# Patient Record
Sex: Female | Born: 1970 | Race: White | Hispanic: No | Marital: Single | Smoking: Current every day smoker
Health system: Southern US, Community
[De-identification: ages and names within clinical notes are randomized; demographics above are authoritative.]

## PROBLEM LIST (undated history)

## (undated) ENCOUNTER — Ambulatory Visit: Admission: EM | Source: Home / Self Care

## (undated) DIAGNOSIS — N760 Acute vaginitis: Secondary | ICD-10-CM

## (undated) DIAGNOSIS — F419 Anxiety disorder, unspecified: Secondary | ICD-10-CM

## (undated) DIAGNOSIS — B9689 Other specified bacterial agents as the cause of diseases classified elsewhere: Secondary | ICD-10-CM

## (undated) DIAGNOSIS — J45909 Unspecified asthma, uncomplicated: Secondary | ICD-10-CM

## (undated) DIAGNOSIS — N730 Acute parametritis and pelvic cellulitis: Secondary | ICD-10-CM

## (undated) HISTORY — PX: WISDOM TOOTH EXTRACTION: SHX21

---

## 2002-11-15 ENCOUNTER — Other Ambulatory Visit: Admission: RE | Admit: 2002-11-15 | Discharge: 2002-11-15 | Payer: Self-pay | Admitting: Obstetrics and Gynecology

## 2006-07-09 ENCOUNTER — Ambulatory Visit (HOSPITAL_COMMUNITY): Payer: Self-pay | Admitting: Psychiatry

## 2007-02-16 ENCOUNTER — Ambulatory Visit (HOSPITAL_COMMUNITY): Payer: Self-pay | Admitting: Psychiatry

## 2007-10-05 ENCOUNTER — Ambulatory Visit (HOSPITAL_COMMUNITY): Payer: Self-pay | Admitting: Psychiatry

## 2011-08-06 ENCOUNTER — Encounter (HOSPITAL_BASED_OUTPATIENT_CLINIC_OR_DEPARTMENT_OTHER): Payer: Self-pay

## 2011-08-06 ENCOUNTER — Emergency Department (HOSPITAL_BASED_OUTPATIENT_CLINIC_OR_DEPARTMENT_OTHER)
Admission: EM | Admit: 2011-08-06 | Discharge: 2011-08-06 | Disposition: A | Discharge status: Home / Self Care | Payer: Self-pay | Attending: Emergency Medicine | Admitting: Emergency Medicine

## 2011-08-06 DIAGNOSIS — M545 Low back pain, unspecified: Secondary | ICD-10-CM | POA: Insufficient documentation

## 2011-08-06 DIAGNOSIS — R51 Headache: Secondary | ICD-10-CM | POA: Insufficient documentation

## 2011-08-06 DIAGNOSIS — IMO0002 Reserved for concepts with insufficient information to code with codable children: Secondary | ICD-10-CM | POA: Insufficient documentation

## 2011-08-06 DIAGNOSIS — M546 Pain in thoracic spine: Secondary | ICD-10-CM | POA: Insufficient documentation

## 2011-08-06 DIAGNOSIS — S39012A Strain of muscle, fascia and tendon of lower back, initial encounter: Secondary | ICD-10-CM

## 2011-08-06 DIAGNOSIS — F17200 Nicotine dependence, unspecified, uncomplicated: Secondary | ICD-10-CM | POA: Insufficient documentation

## 2011-08-06 MED ORDER — HYDROCODONE-ACETAMINOPHEN 5-325 MG PO TABS: 1.0000 | ORAL_TABLET | ORAL | Status: AC | PRN

## 2011-08-06 MED ORDER — HYDROCODONE-ACETAMINOPHEN 5-325 MG PO TABS
1.0000 | ORAL_TABLET | Freq: Once | ORAL | Status: AC
  Administered 2011-08-06: 1 via ORAL
  Filled 2011-08-06: qty 1
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
## 2012-01-18 ENCOUNTER — Emergency Department (HOSPITAL_BASED_OUTPATIENT_CLINIC_OR_DEPARTMENT_OTHER)
Admission: EM | Admit: 2012-01-18 | Discharge: 2012-01-18 | Disposition: A | Discharge status: Home / Self Care | Payer: Self-pay | Attending: Emergency Medicine | Admitting: Emergency Medicine

## 2012-01-18 ENCOUNTER — Encounter (HOSPITAL_BASED_OUTPATIENT_CLINIC_OR_DEPARTMENT_OTHER): Payer: Self-pay | Admitting: *Deleted

## 2012-01-18 ENCOUNTER — Emergency Department (HOSPITAL_BASED_OUTPATIENT_CLINIC_OR_DEPARTMENT_OTHER): Payer: Self-pay

## 2012-01-18 DIAGNOSIS — S43499A Other sprain of unspecified shoulder joint, initial encounter: Secondary | ICD-10-CM | POA: Insufficient documentation

## 2012-01-18 DIAGNOSIS — X58XXXA Exposure to other specified factors, initial encounter: Secondary | ICD-10-CM | POA: Insufficient documentation

## 2012-01-18 DIAGNOSIS — M25511 Pain in right shoulder: Secondary | ICD-10-CM

## 2012-01-18 DIAGNOSIS — Z791 Long term (current) use of non-steroidal anti-inflammatories (NSAID): Secondary | ICD-10-CM | POA: Insufficient documentation

## 2012-01-18 DIAGNOSIS — S46819A Strain of other muscles, fascia and tendons at shoulder and upper arm level, unspecified arm, initial encounter: Secondary | ICD-10-CM

## 2012-01-18 DIAGNOSIS — Z79899 Other long term (current) drug therapy: Secondary | ICD-10-CM | POA: Insufficient documentation

## 2012-01-18 DIAGNOSIS — Y939 Activity, unspecified: Secondary | ICD-10-CM | POA: Insufficient documentation

## 2012-01-18 DIAGNOSIS — Y929 Unspecified place or not applicable: Secondary | ICD-10-CM | POA: Insufficient documentation

## 2012-01-18 DIAGNOSIS — F17200 Nicotine dependence, unspecified, uncomplicated: Secondary | ICD-10-CM | POA: Insufficient documentation

## 2012-01-18 MED ORDER — HYDROCODONE-ACETAMINOPHEN 5-500 MG PO TABS: 1.0000 | ORAL_TABLET | Freq: Four times a day (QID) | ORAL | Status: DC | PRN

## 2012-01-18 MED ORDER — CYCLOBENZAPRINE HCL 10 MG PO TABS: 10.0000 mg | ORAL_TABLET | Freq: Two times a day (BID) | ORAL | Status: DC | PRN

## 2012-01-18 NOTE — ED Notes (Signed)
Right arm pain for 3 weeks. No known injury.
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
## 2013-04-11 ENCOUNTER — Emergency Department (HOSPITAL_BASED_OUTPATIENT_CLINIC_OR_DEPARTMENT_OTHER)
Admission: EM | Admit: 2013-04-11 | Discharge: 2013-04-11 | Disposition: A | Discharge status: Home / Self Care | Payer: No Typology Code available for payment source | Attending: Emergency Medicine | Admitting: Emergency Medicine

## 2013-04-11 ENCOUNTER — Encounter (HOSPITAL_BASED_OUTPATIENT_CLINIC_OR_DEPARTMENT_OTHER): Payer: Self-pay | Admitting: Emergency Medicine

## 2013-04-11 DIAGNOSIS — Z3202 Encounter for pregnancy test, result negative: Secondary | ICD-10-CM | POA: Insufficient documentation

## 2013-04-11 DIAGNOSIS — B009 Herpesviral infection, unspecified: Secondary | ICD-10-CM | POA: Insufficient documentation

## 2013-04-11 DIAGNOSIS — N390 Urinary tract infection, site not specified: Secondary | ICD-10-CM | POA: Insufficient documentation

## 2013-04-11 DIAGNOSIS — F17200 Nicotine dependence, unspecified, uncomplicated: Secondary | ICD-10-CM | POA: Insufficient documentation

## 2013-04-11 DIAGNOSIS — N898 Other specified noninflammatory disorders of vagina: Secondary | ICD-10-CM | POA: Insufficient documentation

## 2013-04-11 LAB — WET PREP, GENITAL
Trich, Wet Prep: NONE SEEN
Yeast Wet Prep HPF POC: NONE SEEN

## 2013-04-11 LAB — URINALYSIS, ROUTINE W REFLEX MICROSCOPIC
Bilirubin Urine: NEGATIVE
GLUCOSE, UA: NEGATIVE mg/dL
KETONES UR: NEGATIVE mg/dL
Nitrite: NEGATIVE
PROTEIN: NEGATIVE mg/dL
Specific Gravity, Urine: 1.016 (ref 1.005–1.030)
UROBILINOGEN UA: 0.2 mg/dL (ref 0.0–1.0)
pH: 6.5 (ref 5.0–8.0)

## 2013-04-11 LAB — URINE MICROSCOPIC-ADD ON

## 2013-04-11 LAB — PREGNANCY, URINE: Preg Test, Ur: NEGATIVE

## 2013-04-11 MED ORDER — AZITHROMYCIN 250 MG PO TABS
1000.0000 mg | ORAL_TABLET | Freq: Once | ORAL | Status: AC
Start: 2013-04-11 — End: 2013-04-11
  Administered 2013-04-11: 1000 mg via ORAL
  Filled 2013-04-11: qty 4

## 2013-04-11 MED ORDER — CEFTRIAXONE SODIUM 1 G IJ SOLR
1.0000 g | Freq: Once | INTRAMUSCULAR | Status: AC
  Administered 2013-04-11: 1 g via INTRAMUSCULAR
  Filled 2013-04-11: qty 10

## 2013-04-11 MED ORDER — LIDOCAINE HCL (PF) 1 % IJ SOLN
INTRAMUSCULAR | Status: AC
  Administered 2013-04-11: 5 mL
  Filled 2013-04-11: qty 5

## 2013-04-11 MED ORDER — CEPHALEXIN 500 MG PO CAPS: 500.0000 mg | ORAL_CAPSULE | Freq: Four times a day (QID) | ORAL | Status: DC

## 2013-04-11 NOTE — ED Provider Notes (Signed)
CSN: [id number]     Arrival date & time 04/11/13  1227 History   First MD Initiated Contact with Patient 04/11/13 1230     Chief Complaint  Patient presents with  . Vaginal Bleeding  . Urinary Tract Infection   (Consider location/radiation/quality/duration/timing/severity/associated sxs/prior Treatment) HPI Comments: Patient presents with 5 days of urinary frequency and burning with urination. She reports previous urinary tract infections with similar symptoms. She has, however, had vaginal itching, irritation, burning and discharge as well.  Additionally, patient complains of a "cold sore" on the right side of her mouth. She says she gets these frequently.  Patient is a 43 y.o. female presenting with vaginal bleeding and urinary tract infection.  Vaginal Bleeding Associated symptoms: dysuria   Urinary Tract Infection    History reviewed. No pertinent past medical history. History reviewed. No pertinent past surgical history. No family history on file. History  Substance Use Topics  . Smoking status: Current Every Day Smoker -- 0.50 packs/day  . Smokeless tobacco: Not on file  . Alcohol Use: No   OB History   Grav Para Term Preterm Abortions TAB SAB Ect Mult Living                 Review of Systems  HENT: Positive for mouth sores.   Genitourinary: Positive for dysuria, frequency and vaginal bleeding.  All other systems reviewed and are negative.    Allergies  Review of patient's allergies indicates no known allergies.  Home Medications   Current Outpatient Rx  Name  Route  Sig  Dispense  Refill  . cyclobenzaprine (FLEXERIL) 10 MG tablet   Oral   Take 10 mg by mouth 3 (three) times daily as needed.         . cyclobenzaprine (FLEXERIL) 10 MG tablet   Oral   Take 1 tablet (10 mg total) by mouth 2 (two) times daily as needed for muscle spasms.   20 tablet   0   . HYDROcodone-acetaminophen (VICODIN) 5-500 MG per tablet   Oral   Take 1-2 tablets by mouth every  6 (six) hours as needed for pain.   15 tablet   0   . NAPROXEN PO   Oral   Take 2 tablets by mouth daily as needed. Patient used this medication for her headache.         . traMADol (ULTRAM) 50 MG tablet   Oral   Take 50 mg by mouth every 6 (six) hours as needed.          LMP 03/21/2013 Physical Exam  Constitutional: She is oriented to person, place, and time. She appears well-developed and well-nourished. No distress.  HENT:  Head: Normocephalic and atraumatic.  Right Ear: Hearing normal.  Left Ear: Hearing normal.  Nose: Nose normal.  Mouth/Throat: Oropharynx is clear and moist and mucous membranes are normal.  Eyes: Conjunctivae and EOM are normal. Pupils are equal, round, and reactive to light.  Neck: Normal range of motion. Neck supple.  Cardiovascular: Regular rhythm, S1 normal and S2 normal.  Exam reveals no gallop and no friction rub.   No murmur heard. Pulmonary/Chest: Effort normal and breath sounds normal. No respiratory distress. She exhibits no tenderness.  Abdominal: Soft. Normal appearance and bowel sounds are normal. There is no hepatosplenomegaly. There is no tenderness. There is no rebound, no guarding, no tenderness at McBurney's point and negative Murphy's sign. No hernia.  Genitourinary: Uterus normal. There is no rash or tenderness on the right labia. There  is no rash or tenderness on the left labia. Cervix exhibits motion tenderness. Cervix exhibits no friability. Right adnexum displays no mass, no tenderness and no fullness. Left adnexum displays tenderness. Left adnexum displays no mass and no fullness. No vaginal discharge found.  Musculoskeletal: Normal range of motion.  Neurological: She is alert and oriented to person, place, and time. She has normal strength. No cranial nerve deficit or sensory deficit. Coordination normal. GCS eye subscore is 4. GCS verbal subscore is 5. GCS motor subscore is 6.  Skin: Skin is warm, dry and intact. No rash noted. No  cyanosis.     Psychiatric: She has a normal mood and affect. Her speech is normal and behavior is normal. Thought content normal.    ED Course  Procedures (including critical care time) Labs Review Labs Reviewed  WET PREP, GENITAL - Abnormal; Notable for the following:    Clue Cells Wet Prep HPF POC FEW (*)    WBC, Wet Prep HPF POC FEW (*)    All other components within normal limits  URINALYSIS, ROUTINE W REFLEX MICROSCOPIC - Abnormal; Notable for the following:    APPearance CLOUDY (*)    Hgb urine dipstick MODERATE (*)    Leukocytes, UA MODERATE (*)    All other components within normal limits  URINE MICROSCOPIC-ADD ON - Abnormal; Notable for the following:    Bacteria, UA MANY (*)    All other components within normal limits  GC/CHLAMYDIA PROBE AMP  URINE CULTURE  PREGNANCY, URINE   Imaging Review No results found.  EKG Interpretation   None       MDM  Diagnosis: UTI  Patient presents to the ER for evaluation of dysuria. She has had vaginal irritation, discharge and bleeding as well. Urinalysis shows obvious infection. She did have cervical motion tenderness as well as tenderness in the area of the left adnexa on pelvic exam. No concern for tubo-ovarian abscess. Cervicitis might be secondary to the UTI, but was treated with Ceftin and Zithromax empirically here in the ER. We'll continue Keflex as an outpatient. Followup with OB/GYN.    Gilda Creasehristopher J. Pollina, MD 04/11/13 1359

## 2013-04-11 NOTE — Discharge Instructions (Signed)
Urinary Tract Infection  Urinary tract infections (UTIs) can develop anywhere along your urinary tract. Your urinary tract is your body's drainage system for removing wastes and extra water. Your urinary tract includes two kidneys, two ureters, a bladder, and a urethra. Your kidneys are a pair of bean-shaped organs. Each kidney is about the size of your fist. They are located below your ribs, one on each side of your spine.  CAUSES  Infections are caused by microbes, which are microscopic organisms, including fungi, viruses, and bacteria. These organisms are so small that they can only be seen through a microscope. Bacteria are the microbes that most commonly cause UTIs.  SYMPTOMS   Symptoms of UTIs may vary by age and gender of the patient and by the location of the infection. Symptoms in young women typically include a frequent and intense urge to urinate and a painful, burning feeling in the bladder or urethra during urination. Older women and men are more likely to be tired, shaky, and weak and have muscle aches and abdominal pain. A fever may mean the infection is in your kidneys. Other symptoms of a kidney infection include pain in your back or sides below the ribs, nausea, and vomiting.  DIAGNOSIS  To diagnose a UTI, your caregiver will ask you about your symptoms. Your caregiver also will ask to provide a urine sample. The urine sample will be tested for bacteria and white blood cells. White blood cells are made by your body to help fight infection.  TREATMENT   Typically, UTIs can be treated with medication. Because most UTIs are caused by a bacterial infection, they usually can be treated with the use of antibiotics. The choice of antibiotic and length of treatment depend on your symptoms and the type of bacteria causing your infection.  HOME CARE INSTRUCTIONS   If you were prescribed antibiotics, take them exactly as your caregiver instructs you. Finish the medication even if you feel better after you  have only taken some of the medication.   Drink enough water and fluids to keep your urine clear or pale yellow.   Avoid caffeine, tea, and carbonated beverages. They tend to irritate your bladder.   Empty your bladder often. Avoid holding urine for long periods of time.   Empty your bladder before and after sexual intercourse.   After a bowel movement, women should cleanse from front to back. Use each tissue only once.  SEEK MEDICAL CARE IF:    You have back pain.   You develop a fever.   Your symptoms do not begin to resolve within 3 days.  SEEK IMMEDIATE MEDICAL CARE IF:    You have severe back pain or lower abdominal pain.   You develop chills.   You have nausea or vomiting.   You have continued burning or discomfort with urination.  MAKE SURE YOU:    Understand these instructions.   Will watch your condition.   Will get help right away if you are not doing well or get worse.  Document Released: 12/17/2004 Document Revised: 09/08/2011 Document Reviewed: 04/17/2011  [employer] Patient Information 2014 [employer].

## 2013-04-11 NOTE — ED Notes (Signed)
Assisted with pelvic exam.

## 2013-04-11 NOTE — ED Notes (Signed)
Vaginal bleeding and dysuria.

## 2013-04-11 NOTE — ED Notes (Signed)
No noted rash on Pt. After IM meds given.  Pt. Verbalizes instructions of home care and d/c instructions.

## 2013-04-12 LAB — GC/CHLAMYDIA PROBE AMP
CT PROBE, AMP APTIMA: NEGATIVE
GC PROBE AMP APTIMA: NEGATIVE

## 2013-04-13 LAB — URINE CULTURE: Colony Count: >=100000

## 2013-11-16 ENCOUNTER — Emergency Department (HOSPITAL_BASED_OUTPATIENT_CLINIC_OR_DEPARTMENT_OTHER)
Admission: EM | Admit: 2013-11-16 | Discharge: 2013-11-16 | Disposition: A | Discharge status: Home / Self Care | Payer: No Typology Code available for payment source | Attending: Emergency Medicine | Admitting: Emergency Medicine

## 2013-11-16 ENCOUNTER — Encounter (HOSPITAL_BASED_OUTPATIENT_CLINIC_OR_DEPARTMENT_OTHER): Payer: Self-pay | Admitting: Emergency Medicine

## 2013-11-16 DIAGNOSIS — S8990XA Unspecified injury of unspecified lower leg, initial encounter: Secondary | ICD-10-CM | POA: Insufficient documentation

## 2013-11-16 DIAGNOSIS — S81809A Unspecified open wound, unspecified lower leg, initial encounter: Principal | ICD-10-CM

## 2013-11-16 DIAGNOSIS — Y9289 Other specified places as the place of occurrence of the external cause: Secondary | ICD-10-CM | POA: Insufficient documentation

## 2013-11-16 DIAGNOSIS — Z792 Long term (current) use of antibiotics: Secondary | ICD-10-CM | POA: Insufficient documentation

## 2013-11-16 DIAGNOSIS — Z880 Allergy status to penicillin: Secondary | ICD-10-CM | POA: Insufficient documentation

## 2013-11-16 DIAGNOSIS — W108XXA Fall (on) (from) other stairs and steps, initial encounter: Secondary | ICD-10-CM | POA: Insufficient documentation

## 2013-11-16 DIAGNOSIS — IMO0002 Reserved for concepts with insufficient information to code with codable children: Secondary | ICD-10-CM | POA: Insufficient documentation

## 2013-11-16 DIAGNOSIS — S99919A Unspecified injury of unspecified ankle, initial encounter: Secondary | ICD-10-CM

## 2013-11-16 DIAGNOSIS — S81802A Unspecified open wound, left lower leg, initial encounter: Secondary | ICD-10-CM

## 2013-11-16 DIAGNOSIS — Y9389 Activity, other specified: Secondary | ICD-10-CM | POA: Insufficient documentation

## 2013-11-16 DIAGNOSIS — S81801A Unspecified open wound, right lower leg, initial encounter: Secondary | ICD-10-CM

## 2013-11-16 DIAGNOSIS — S91009A Unspecified open wound, unspecified ankle, initial encounter: Principal | ICD-10-CM

## 2013-11-16 DIAGNOSIS — S99929A Unspecified injury of unspecified foot, initial encounter: Secondary | ICD-10-CM

## 2013-11-16 DIAGNOSIS — F17200 Nicotine dependence, unspecified, uncomplicated: Secondary | ICD-10-CM | POA: Insufficient documentation

## 2013-11-16 DIAGNOSIS — S81009A Unspecified open wound, unspecified knee, initial encounter: Secondary | ICD-10-CM | POA: Insufficient documentation

## 2013-11-16 LAB — CBG MONITORING, ED: GLUCOSE-CAPILLARY: 88 mg/dL (ref 70–99)

## 2013-11-16 MED ORDER — BACITRACIN ZINC 500 UNIT/GM EX OINT: 1.0000 "application " | TOPICAL_OINTMENT | Freq: Two times a day (BID) | CUTANEOUS | Status: DC

## 2013-11-16 MED ORDER — OXYCODONE-ACETAMINOPHEN 5-325 MG PO TABS: 2.0000 | ORAL_TABLET | Freq: Four times a day (QID) | ORAL | Status: DC | PRN

## 2013-11-16 NOTE — ED Provider Notes (Signed)
CSN: [id number]     Arrival date & time 11/16/13  1559 History   First MD Initiated Contact with Patient 11/16/13 1633     Chief Complaint  Patient presents with  . Fall     (Consider location/radiation/quality/duration/timing/severity/associated sxs/prior Treatment) Patient is a 43 y.o. female presenting with fall. The history is provided by the patient.  Fall This is a new problem. Episode onset: 1-2 weeks ago. Episode frequency: once. The problem has been resolved. Pertinent negatives include no chest pain, no abdominal pain, no headaches and no shortness of breath. Nothing aggravates the symptoms. Nothing relieves the symptoms. Treatments tried: keflex, hydrogen peroxide. The treatment provided no relief.    History reviewed. No pertinent past medical history. History reviewed. No pertinent past surgical history. No family history on file. History  Substance Use Topics  . Smoking status: Current Every Day Smoker -- 0.50 packs/day  . Smokeless tobacco: Not on file  . Alcohol Use: No   OB History   Grav Para Term Preterm Abortions TAB SAB Ect Mult Living                 Review of Systems  Constitutional: Negative for fever and fatigue.  HENT: Negative for congestion and drooling.   Eyes: Negative for pain.  Respiratory: Negative for cough and shortness of breath.   Cardiovascular: Negative for chest pain.  Gastrointestinal: Negative for nausea, vomiting, abdominal pain and diarrhea.  Genitourinary: Negative for dysuria and hematuria.  Musculoskeletal: Negative for back pain, gait problem and neck pain.  Skin: Negative for color change.  Neurological: Negative for dizziness and headaches.  Hematological: Negative for adenopathy.  Psychiatric/Behavioral: Negative for behavioral problems.  All other systems reviewed and are negative.     Allergies  Penicillins  Home Medications   Prior to Admission medications   Medication Sig Start Date End Date Taking?  Authorizing Provider  ibuprofen (ADVIL,MOTRIN) 200 MG tablet Take 200 mg by mouth every 6 (six) hours as needed.   Yes Historical Provider, MD  bacitracin ointment Apply 1 application topically 2 (two) times daily. 11/16/13   Purvis Sheffield, MD  cephALEXin (KEFLEX) 500 MG capsule Take 1 capsule (500 mg total) by mouth 4 (four) times daily. 04/11/13   Gilda Crease, MD  oxyCODONE-acetaminophen (PERCOCET) 5-325 MG per tablet Take 2 tablets by mouth every 6 (six) hours as needed for moderate pain. 11/16/13   Purvis Sheffield, MD  traMADol (ULTRAM) 50 MG tablet Take 50 mg by mouth every 6 (six) hours as needed.    Historical Provider, MD   BP 129/66  Pulse 72  Temp(Src) 97.8 F (36.6 C) (Oral)  Ht  (1.727 m)  Wt 125 lb 8 oz (56.926 kg)  BMI 19.09 kg/m2  SpO2 97% Physical Exam  Nursing note and vitals reviewed. Constitutional: She is oriented to person, place, and time. She appears well-developed and well-nourished.  HENT:  Head: Normocephalic and atraumatic.  Mouth/Throat: Oropharynx is clear and moist. No oropharyngeal exudate.  Eyes: Conjunctivae and EOM are normal. Pupils are equal, round, and reactive to light.  Neck: Normal range of motion. Neck supple.  Cardiovascular: Normal rate, regular rhythm, normal heart sounds and intact distal pulses.  Exam reveals no gallop and no friction rub.   No murmur heard. Pulmonary/Chest: Effort normal and breath sounds normal. No respiratory distress. She has no wheezes.  Abdominal: Soft. Bowel sounds are normal. There is no tenderness. There is no rebound and no guarding.  Musculoskeletal: Normal range of  motion. She exhibits no edema and no tenderness.  1 moderate size abrasion noted centrally on the right anterior shin with mild maceration.  2 small abrasions to the left leg, one on the left knee and one on the left dorsal ankle. He is also had mild maceration.  Neurological: She is alert and oriented to person, place, and time.   Skin: Skin is warm and dry.  Psychiatric: She has a normal mood and affect. Her behavior is normal.    ED Course  Procedures (including critical care time) Labs Review Labs Reviewed  CBG MONITORING, ED    Imaging Review No results found.   EKG Interpretation None      MDM   Final diagnoses:  Wound of left leg, initial encounter  Wound of right leg, initial encounter    5:16 PM 43 y.o. female who presents with wounds to her lower extremities. She states that she fell down some stairs 1-2 weeks ago. She states that she had been using hydrogen peroxide everyday and the wounds are not healing. She states that she was seen by an urgent care several days ago and started on Keflex. She would like something to heal the wounds. Recommended routine soap and water for cleansing and will provide a prescription for bacitracin ointment. No evidence of infection on my exam.  5:17 PM:  I have discussed the diagnosis/risks/treatment options with the patient and believe the pt to be eligible for discharge home to follow-up with her pcp as needed. We also discussed returning to the ED immediately if new or worsening sx occur. We discussed the sx which are most concerning (e.g., inc redness, inc swelling, drainage) that necessitate immediate return. Medications administered to the patient during their visit and any new prescriptions provided to the patient are listed below.  Medications given during this visit Medications - No data to display  New Prescriptions   BACITRACIN OINTMENT    Apply 1 application topically 2 (two) times daily.   OXYCODONE-ACETAMINOPHEN (PERCOCET) 5-325 MG PER TABLET    Take 2 tablets by mouth every 6 (six) hours as needed for moderate pain.       Purvis Sheffield, MD 11/16/13 1719

## 2013-11-16 NOTE — ED Notes (Signed)
Fell on concrete stairs  X 7-10 days ago.  No infected

## 2014-04-26 ENCOUNTER — Encounter (HOSPITAL_BASED_OUTPATIENT_CLINIC_OR_DEPARTMENT_OTHER): Payer: Self-pay

## 2014-04-26 ENCOUNTER — Emergency Department (HOSPITAL_BASED_OUTPATIENT_CLINIC_OR_DEPARTMENT_OTHER)
Admission: EM | Admit: 2014-04-26 | Discharge: 2014-04-26 | Disposition: A | Discharge status: Home / Self Care | Payer: No Typology Code available for payment source | Attending: Emergency Medicine | Admitting: Emergency Medicine

## 2014-04-26 DIAGNOSIS — Z720 Tobacco use: Secondary | ICD-10-CM | POA: Insufficient documentation

## 2014-04-26 DIAGNOSIS — N390 Urinary tract infection, site not specified: Secondary | ICD-10-CM

## 2014-04-26 DIAGNOSIS — Z3202 Encounter for pregnancy test, result negative: Secondary | ICD-10-CM | POA: Insufficient documentation

## 2014-04-26 DIAGNOSIS — Z880 Allergy status to penicillin: Secondary | ICD-10-CM | POA: Insufficient documentation

## 2014-04-26 LAB — URINALYSIS, ROUTINE W REFLEX MICROSCOPIC
BILIRUBIN URINE: NEGATIVE
Glucose, UA: NEGATIVE mg/dL
KETONES UR: NEGATIVE mg/dL
Nitrite: POSITIVE — AB
PROTEIN: 100 mg/dL — AB
SPECIFIC GRAVITY, URINE: 1.014 (ref 1.005–1.030)
UROBILINOGEN UA: 0.2 mg/dL (ref 0.0–1.0)
pH: 6 (ref 5.0–8.0)

## 2014-04-26 LAB — COMPREHENSIVE METABOLIC PANEL
ALBUMIN: 3.9 g/dL (ref 3.5–5.2)
ALK PHOS: 52 U/L (ref 39–117)
ALT: 15 U/L (ref 0–35)
AST: 18 U/L (ref 0–37)
Anion gap: 7 (ref 5–15)
BILIRUBIN TOTAL: 0.4 mg/dL (ref 0.3–1.2)
BUN: 7 mg/dL (ref 6–23)
CHLORIDE: 99 mmol/L (ref 96–112)
CO2: 26 mmol/L (ref 19–32)
Calcium: 8.4 mg/dL (ref 8.4–10.5)
Creatinine, Ser: 0.66 mg/dL (ref 0.50–1.10)
GFR calc Af Amer: >90 mL/min (ref >90)
Glucose, Bld: 110 mg/dL — ABNORMAL HIGH (ref 70–99)
POTASSIUM: 3.3 mmol/L — AB (ref 3.5–5.1)
SODIUM: 132 mmol/L — AB (ref 135–145)
Total Protein: 7.9 g/dL (ref 6.0–8.3)

## 2014-04-26 LAB — CBC
HEMATOCRIT: 37.6 % (ref 36.0–46.0)
HEMOGLOBIN: 12.7 g/dL (ref 12.0–15.0)
MCH: 31.5 pg (ref 26.0–34.0)
MCHC: 33.8 g/dL (ref 30.0–36.0)
MCV: 93.3 fL (ref 78.0–100.0)
PLATELETS: 318 10*3/uL (ref 150–400)
RBC: 4.03 MIL/uL (ref 3.87–5.11)
RDW: 11.8 % (ref 11.5–15.5)
WBC: 16.5 10*3/uL — ABNORMAL HIGH (ref 4.0–10.5)

## 2014-04-26 LAB — URINE MICROSCOPIC-ADD ON

## 2014-04-26 LAB — PREGNANCY, URINE: PREG TEST UR: NEGATIVE

## 2014-04-26 MED ORDER — TRAMADOL HCL 50 MG PO TABS: 50.0000 mg | ORAL_TABLET | Freq: Four times a day (QID) | ORAL | Status: DC | PRN

## 2014-04-26 MED ORDER — CEPHALEXIN 500 MG PO CAPS: 500.0000 mg | ORAL_CAPSULE | Freq: Two times a day (BID) | ORAL | Status: DC

## 2014-04-26 NOTE — Discharge Instructions (Signed)

## 2014-04-26 NOTE — ED Provider Notes (Signed)
CSN: [id number]     Arrival date & time 04/26/14  1717 History   First MD Initiated Contact with Patient 04/26/14 1728     Chief Complaint  Patient presents with  . Vaginal Bleeding     (Consider location/radiation/quality/duration/timing/severity/associated sxs/prior Treatment) HPI Comments: Pt started her period on 2/1 and that day it was heavier then normal. She come in today with bilateral leg pain, back pain and chills. Denies fever, abdominal pain or cough. Taken ibuprofen without relief. No vomiting or diarrhea  The history is provided by the patient. No language interpreter was used.    History reviewed. No pertinent past medical history. History reviewed. No pertinent past surgical history. No family history on file. History  Substance Use Topics  . Smoking status: Current Every Day Smoker -- 0.50 packs/day    Types: Cigarettes  . Smokeless tobacco: Not on file  . Alcohol Use: No   OB History    No data available     Review of Systems  All other systems reviewed and are negative.     Allergies  Peanut-containing drug products and Penicillins  Home Medications   Prior to Admission medications   Medication Sig Start Date End Date Taking? Authorizing Provider  ibuprofen (ADVIL,MOTRIN) 200 MG tablet Take 200 mg by mouth every 6 (six) hours as needed.    Historical Provider, MD  traMADol (ULTRAM) 50 MG tablet Take 50 mg by mouth every 6 (six) hours as needed.    Historical Provider, MD   BP 139/79 mmHg  Pulse 101  Temp(Src) 99 F (37.2 C) (Oral)  Resp 18  Ht 5\' 8"  (1.727 m)  Wt 135 lb (61.236 kg)  BMI 20.53 kg/m2  SpO2 100%  LMP 04/23/2014 Physical Exam  Constitutional: She is oriented to person, place, and time. She appears well-developed and well-nourished.  HENT:  Head: Normocephalic and atraumatic.  Cardiovascular: Normal rate and regular rhythm.   Pulmonary/Chest: Effort normal and breath sounds normal.  Abdominal: Soft. Bowel sounds are normal.  There is no tenderness. There is no CVA tenderness.  Musculoskeletal: Normal range of motion.  Neurological: She is alert and oriented to person, place, and time. She exhibits normal muscle tone. Coordination normal.  Skin: Skin is warm and dry.  Psychiatric: She has a normal mood and affect.  Nursing note and vitals reviewed.   ED Course  Procedures (including critical care time) Labs Review Labs Reviewed  CBC - Abnormal; Notable for the following:    WBC 16.5 (*)    All other components within normal limits  URINALYSIS, ROUTINE W REFLEX MICROSCOPIC - Abnormal; Notable for the following:    APPearance CLOUDY (*)    Hgb urine dipstick MODERATE (*)    Protein, ur 100 (*)    Nitrite POSITIVE (*)    Leukocytes, UA LARGE (*)    All other components within normal limits  URINE MICROSCOPIC-ADD ON - Abnormal; Notable for the following:    Squamous Epithelial / LPF FEW (*)    Bacteria, UA MANY (*)    All other components within normal limits  PREGNANCY, URINE  COMPREHENSIVE METABOLIC PANEL    Imaging Review No results found.   EKG Interpretation None      MDM   Final diagnoses:  UTI (lower urinary tract infection)    Will treat for uti with keflex and ultram. Discussed return precautions with pt. cx sent   Teressa LowerVrinda Danique Hartsough, NP 04/26/14 1833  Candyce ChurnJohn David Wofford III, MD 04/26/14 [phone]

## 2014-04-26 NOTE — ED Notes (Signed)
C/o heavy period started 2/1-in last 2 days c/o bilat leg pain, lower back pain, chills

## 2014-04-29 LAB — URINE CULTURE: Colony Count: >=100000

## 2014-05-01 ENCOUNTER — Telehealth (HOSPITAL_COMMUNITY): Payer: Self-pay

## 2014-05-01 NOTE — Telephone Encounter (Signed)
Post ED Visit - Positive Culture Follow-up  Culture report reviewed by antimicrobial stewardship pharmacist: []  Wes Dulaney, Pharm.D., BCPS [x]  Celedonio MiyamotoJeremy Frens, Pharm.D., BCPS []  Georgina PillionElizabeth Martin, Pharm.D., BCPS []  Fern AcresMinh Pham, [address]Ph[address] BCPS, AAHIVP []  Estella HuskMichelle Turner, Pharm.D., BCPS, AAHIVP []  Elder CyphersLorie Poole, [address]Ph[address] BCPS  Positive Urine culture, >/= 100,000 colonies -> E Coli Treated with Cephalexin, organism sensitive to the same and no further patient follow-up is required at this time.  Cindy RightClark, Cindy Bailey 05/01/2014, 4:35 AM

## 2014-06-03 ENCOUNTER — Emergency Department (HOSPITAL_BASED_OUTPATIENT_CLINIC_OR_DEPARTMENT_OTHER)
Admission: EM | Admit: 2014-06-03 | Discharge: 2014-06-03 | Disposition: A | Discharge status: Home / Self Care | Payer: Self-pay | Attending: Emergency Medicine | Admitting: Emergency Medicine

## 2014-06-03 ENCOUNTER — Encounter (HOSPITAL_BASED_OUTPATIENT_CLINIC_OR_DEPARTMENT_OTHER): Payer: Self-pay | Admitting: *Deleted

## 2014-06-03 DIAGNOSIS — B373 Candidiasis of vulva and vagina: Secondary | ICD-10-CM | POA: Insufficient documentation

## 2014-06-03 DIAGNOSIS — Z720 Tobacco use: Secondary | ICD-10-CM | POA: Insufficient documentation

## 2014-06-03 DIAGNOSIS — Z792 Long term (current) use of antibiotics: Secondary | ICD-10-CM | POA: Insufficient documentation

## 2014-06-03 DIAGNOSIS — Z79899 Other long term (current) drug therapy: Secondary | ICD-10-CM | POA: Insufficient documentation

## 2014-06-03 DIAGNOSIS — N739 Female pelvic inflammatory disease, unspecified: Secondary | ICD-10-CM | POA: Insufficient documentation

## 2014-06-03 DIAGNOSIS — Z3202 Encounter for pregnancy test, result negative: Secondary | ICD-10-CM | POA: Insufficient documentation

## 2014-06-03 DIAGNOSIS — N730 Acute parametritis and pelvic cellulitis: Secondary | ICD-10-CM

## 2014-06-03 DIAGNOSIS — Z880 Allergy status to penicillin: Secondary | ICD-10-CM | POA: Insufficient documentation

## 2014-06-03 DIAGNOSIS — B3731 Acute candidiasis of vulva and vagina: Secondary | ICD-10-CM

## 2014-06-03 LAB — URINALYSIS, ROUTINE W REFLEX MICROSCOPIC
Bilirubin Urine: NEGATIVE
Glucose, UA: NEGATIVE mg/dL
Hgb urine dipstick: NEGATIVE
Ketones, ur: NEGATIVE mg/dL
Leukocytes, UA: NEGATIVE
Nitrite: NEGATIVE
PH: 5.5 (ref 5.0–8.0)
PROTEIN: NEGATIVE mg/dL
SPECIFIC GRAVITY, URINE: 1.02 (ref 1.005–1.030)
Urobilinogen, UA: 0.2 mg/dL (ref 0.0–1.0)

## 2014-06-03 LAB — WET PREP, GENITAL
Trich, Wet Prep: NONE SEEN
YEAST WET PREP: NONE SEEN

## 2014-06-03 LAB — PREGNANCY, URINE: Preg Test, Ur: NEGATIVE

## 2014-06-03 MED ORDER — DOXYCYCLINE HYCLATE 100 MG PO CAPS: 100.0000 mg | ORAL_CAPSULE | Freq: Two times a day (BID) | ORAL | Status: DC

## 2014-06-03 MED ORDER — TRAMADOL HCL 50 MG PO TABS: 50.0000 mg | ORAL_TABLET | Freq: Four times a day (QID) | ORAL | Status: DC | PRN

## 2014-06-03 MED ORDER — CEFTRIAXONE SODIUM 250 MG IJ SOLR
250.0000 mg | Freq: Once | INTRAMUSCULAR | Status: AC
  Administered 2014-06-03: 250 mg via INTRAMUSCULAR
  Filled 2014-06-03: qty 250

## 2014-06-03 MED ORDER — DOXYCYCLINE HYCLATE 100 MG PO TABS
100.0000 mg | ORAL_TABLET | Freq: Once | ORAL | Status: AC
  Administered 2014-06-03: 100 mg via ORAL
  Filled 2014-06-03: qty 1

## 2014-06-03 MED ORDER — OXYCODONE-ACETAMINOPHEN 5-325 MG PO TABS
1.0000 | ORAL_TABLET | Freq: Once | ORAL | Status: AC
  Administered 2014-06-03: 1 via ORAL
  Filled 2014-06-03: qty 1

## 2014-06-03 MED ORDER — LIDOCAINE HCL (PF) 1 % IJ SOLN
INTRAMUSCULAR | Status: AC
  Filled 2014-06-03: qty 5

## 2014-06-03 NOTE — ED Provider Notes (Signed)
CSN: [id number]     Arrival date & time 06/03/14  1647 History  This chart was scribed for Cindy ScottMartha Linker, MD by Roxy Cedarhandni Bhalodia, ED Scribe. This patient was seen in room MH11/MH11 and the patient's care was started at 7:02 PM.   Chief Complaint  Patient presents with  . Abdominal Pain   Patient is a 44 y.o. female presenting with abdominal pain. The history is provided by the patient. No language interpreter was used.  Abdominal Pain Pain location:  LLQ, RLQ and suprapubic Pain quality: aching, bloating and cramping   Pain radiates to:  Does not radiate Pain severity:  Moderate Onset quality:  Gradual Duration:  2 days Timing:  Intermittent Progression:  Unchanged Chronicity:  New Relieved by:  Nothing Worsened by:  Nothing tried Ineffective treatments:  None tried Associated symptoms: no diarrhea, no dysuria, no nausea, no vaginal discharge and no vomiting     HPI Comments: Cindy CraftJennifer A Bailey is a 44 y.o. female who presents to the Emergency Department complaining of moderate, intermittent, gradually worsening lower bilateral abdominal pain that began 2 days ago. She describes her pain as cramping, bloating and aching. She denies associated nausea, vomiting, diarrhea, vaginal discharge, increased urgency or frequency. Patient's last bowel movement was earlier today. She reports 1 episode of soft stool this morning.  History reviewed. No pertinent past medical history. History reviewed. No pertinent past surgical history. No family history on file. History  Substance Use Topics  . Smoking status: Current Every Day Smoker -- 0.50 packs/day    Types: Cigarettes  . Smokeless tobacco: Never Used  . Alcohol Use: No   OB History    No data available     Review of Systems  Gastrointestinal: Positive for abdominal pain. Negative for nausea, vomiting and diarrhea.  Genitourinary: Negative for dysuria, urgency, frequency and vaginal discharge.  All other systems reviewed and are  negative.  Allergies  Peanut-containing drug products and Penicillins  Home Medications   Prior to Admission medications   Medication Sig Start Date End Date Taking? Authorizing Provider  cephALEXin (KEFLEX) 500 MG capsule Take 1 capsule (500 mg total) by mouth 2 (two) times daily. 04/26/14   Teressa LowerVrinda Pickering, NP  doxycycline (VIBRAMYCIN) 100 MG capsule Take 1 capsule (100 mg total) by mouth 2 (two) times daily. 06/03/14   Cindy ScottMartha Linker, MD  ibuprofen (ADVIL,MOTRIN) 200 MG tablet Take 200 mg by mouth every 6 (six) hours as needed.    Historical Provider, MD  traMADol (ULTRAM) 50 MG tablet Take 1 tablet (50 mg total) by mouth every 6 (six) hours as needed. 06/03/14   Cindy ScottMartha Linker, MD   Triage Vitals: BP 140/71 mmHg  Pulse 60  Temp(Src) 97.9 F (36.6 C) (Oral)  Resp 18  Ht 5\' 8"  (1.727 m)  Wt 140 lb (63.504 kg)  BMI 21.29 kg/m2  SpO2 100%  LMP 05/07/2014 Vitals reviewed Physical Exam  Physical Examination: General appearance - alert, well appearing, and in no distress Mental status - alert, oriented to person, place, and time Eyes - no conjunctival injection, no scleral icterus Mouth - mucous membranes moist, pharynx normal without lesions Chest - clear to auscultation, no wheezes, rales or rhonchi, symmetric air entry Heart - normal rate, regular rhythm, normal S1, S2, no murmurs, rubs, clicks or gallops Abdomen - soft, mild ttp in lower abdomen bilaterally, no gaurding or rebound, nondistended, no masses or organomegaly, nabs Pelvic - normal external genitalia, vulva, vagina- no erythema or rash seen, thick white discharge noted,  mild CMT, no adnexal tenderness Extremities - peripheral pulses normal, no pedal edema, no clubbing or cyanosis Skin - normal coloration and turgor, no rashes  ED Course  Procedures (including critical care time)  DIAGNOSTIC STUDIES: Oxygen Saturation is 100% on RA, normal by my interpretation.    COORDINATION OF CARE: 7:04 PM- Discussed plans to  order diagnostic urinalysis and lab work. Pt advised of plan for treatment and pt agrees.  Labs Review Labs Reviewed  WET PREP, GENITAL - Abnormal; Notable for the following:    Clue Cells Wet Prep HPF POC FEW (*)    WBC, Wet Prep HPF POC FEW (*)    All other components within normal limits  PREGNANCY, URINE  URINALYSIS, ROUTINE W REFLEX MICROSCOPIC  PREGNANCY, URINE  URINALYSIS, ROUTINE W REFLEX MICROSCOPIC  URINALYSIS, ROUTINE W REFLEX MICROSCOPIC  PREGNANCY, URINE  GC/CHLAMYDIA PROBE AMP ([place])  GC/CHLAMYDIA PROBE AMP ([place])   Imaging Review No results found.   EKG Interpretation None     MDM   Final diagnoses:  PID (acute pelvic inflammatory disease)  Candidal vaginitis   Pt with lower abdominal pain, CMT on exam, clinically also has yeast vaginitis.  genprobe pending.  Pt states she has had similar symptoms in the past with pelvic infections.  No adnexal tenderness to suggest ovarian torsion or TOA.   Patient is overall nontoxic and well hydrated in appearance.    Will treat with rocephin and doxy for presumed PID- pt is allergic to PCN but has tolerated rocephin in the past per chart review.     I personally performed the services described in this documentation, which was scribed in my presence. The recorded information has been reviewed and is accurate.   Cindy Scott, MD 06/03/14 [phone]

## 2014-06-03 NOTE — ED Notes (Signed)
Pt states lower bilateral abdominal pain for past "few days".  Pt also states vaginal "burning" along with swollen and red vulva.  Denies n/v/d.  Last BM today.  LMP 2nd week Feb.

## 2014-06-03 NOTE — ED Notes (Signed)
Reports dysuria and abd pain x 1 week

## 2014-06-03 NOTE — Discharge Instructions (Signed)
Return to the ED with any concerns including fever/chills, vomiting and not able to keep down antibiotics or liquids, worsening pain, decreased level of alertness/lethargy, or any other alarming symptoms

## 2014-06-04 LAB — URINALYSIS, ROUTINE W REFLEX MICROSCOPIC

## 2014-06-04 LAB — PREGNANCY, URINE

## 2014-06-05 LAB — GC/CHLAMYDIA PROBE AMP (~~LOC~~) NOT AT ARMC
CHLAMYDIA, DNA PROBE: NEGATIVE
NEISSERIA GONORRHEA: NEGATIVE

## 2014-06-22 ENCOUNTER — Emergency Department (HOSPITAL_BASED_OUTPATIENT_CLINIC_OR_DEPARTMENT_OTHER): Payer: Self-pay

## 2014-06-22 ENCOUNTER — Emergency Department (HOSPITAL_BASED_OUTPATIENT_CLINIC_OR_DEPARTMENT_OTHER)
Admission: EM | Admit: 2014-06-22 | Discharge: 2014-06-22 | Disposition: A | Discharge status: Home / Self Care | Payer: Self-pay | Attending: Emergency Medicine | Admitting: Emergency Medicine

## 2014-06-22 ENCOUNTER — Encounter (HOSPITAL_BASED_OUTPATIENT_CLINIC_OR_DEPARTMENT_OTHER): Payer: Self-pay

## 2014-06-22 DIAGNOSIS — N83201 Unspecified ovarian cyst, right side: Secondary | ICD-10-CM

## 2014-06-22 DIAGNOSIS — N8320 Unspecified ovarian cysts: Secondary | ICD-10-CM | POA: Insufficient documentation

## 2014-06-22 DIAGNOSIS — N83202 Unspecified ovarian cyst, left side: Secondary | ICD-10-CM

## 2014-06-22 DIAGNOSIS — Z720 Tobacco use: Secondary | ICD-10-CM | POA: Insufficient documentation

## 2014-06-22 DIAGNOSIS — Z3202 Encounter for pregnancy test, result negative: Secondary | ICD-10-CM | POA: Insufficient documentation

## 2014-06-22 DIAGNOSIS — R109 Unspecified abdominal pain: Secondary | ICD-10-CM

## 2014-06-22 DIAGNOSIS — Z880 Allergy status to penicillin: Secondary | ICD-10-CM | POA: Insufficient documentation

## 2014-06-22 HISTORY — DX: Acute parametritis and pelvic cellulitis: N73.0

## 2014-06-22 LAB — COMPREHENSIVE METABOLIC PANEL
ALT: 17 U/L (ref 0–35)
AST: 17 U/L (ref 0–37)
Albumin: 4.6 g/dL (ref 3.5–5.2)
Alkaline Phosphatase: 35 U/L — ABNORMAL LOW (ref 39–117)
Anion gap: 10 (ref 5–15)
BUN: 20 mg/dL (ref 6–23)
CO2: 25 mmol/L (ref 19–32)
CREATININE: 0.62 mg/dL (ref 0.50–1.10)
Calcium: 8.8 mg/dL (ref 8.4–10.5)
Chloride: 101 mmol/L (ref 96–112)
GFR calc Af Amer: >90 mL/min (ref >90)
Glucose, Bld: 91 mg/dL (ref 70–99)
Potassium: 3.2 mmol/L — ABNORMAL LOW (ref 3.5–5.1)
Sodium: 136 mmol/L (ref 135–145)
TOTAL PROTEIN: 7.8 g/dL (ref 6.0–8.3)
Total Bilirubin: 0.1 mg/dL — ABNORMAL LOW (ref 0.3–1.2)

## 2014-06-22 LAB — URINALYSIS, ROUTINE W REFLEX MICROSCOPIC
Bilirubin Urine: NEGATIVE
Glucose, UA: NEGATIVE mg/dL
HGB URINE DIPSTICK: NEGATIVE
Ketones, ur: NEGATIVE mg/dL
Leukocytes, UA: NEGATIVE
Nitrite: NEGATIVE
PROTEIN: NEGATIVE mg/dL
Specific Gravity, Urine: 1.023 (ref 1.005–1.030)
UROBILINOGEN UA: 0.2 mg/dL (ref 0.0–1.0)
pH: 5 (ref 5.0–8.0)

## 2014-06-22 LAB — CBC WITH DIFFERENTIAL/PLATELET
Basophils Absolute: 0.1 10*3/uL (ref 0.0–0.1)
Basophils Relative: 1 % (ref 0–1)
Eosinophils Absolute: 0.3 10*3/uL (ref 0.0–0.7)
Eosinophils Relative: 3 % (ref 0–5)
HEMATOCRIT: 39.5 % (ref 36.0–46.0)
Hemoglobin: 13.7 g/dL (ref 12.0–15.0)
LYMPHS PCT: 43 % (ref 12–46)
Lymphs Abs: 4.5 10*3/uL — ABNORMAL HIGH (ref 0.7–4.0)
MCH: 31.2 pg (ref 26.0–34.0)
MCHC: 34.7 g/dL (ref 30.0–36.0)
MCV: 90 fL (ref 78.0–100.0)
Monocytes Absolute: 0.7 10*3/uL (ref 0.1–1.0)
Monocytes Relative: 7 % (ref 3–12)
NEUTROS ABS: 4.9 10*3/uL (ref 1.7–7.7)
Neutrophils Relative %: 46 % (ref 43–77)
Platelets: 370 10*3/uL (ref 150–400)
RBC: 4.39 MIL/uL (ref 3.87–5.11)
RDW: 11.9 % (ref 11.5–15.5)
WBC: 10.4 10*3/uL (ref 4.0–10.5)

## 2014-06-22 LAB — PREGNANCY, URINE: PREG TEST UR: NEGATIVE

## 2014-06-22 MED ORDER — IOHEXOL 300 MG/ML  SOLN
100.0000 mL | Freq: Once | INTRAMUSCULAR | Status: AC | PRN
  Administered 2014-06-22: 100 mL via INTRAVENOUS

## 2014-06-22 MED ORDER — TRAMADOL HCL 50 MG PO TABS: 50.0000 mg | ORAL_TABLET | Freq: Four times a day (QID) | ORAL | Status: DC | PRN

## 2014-06-22 MED ORDER — TRAMADOL HCL 50 MG PO TABS
50.0000 mg | ORAL_TABLET | Freq: Once | ORAL | Status: AC
  Administered 2014-06-22: 50 mg via ORAL

## 2014-06-22 MED ORDER — TRAMADOL HCL 50 MG PO TABS
ORAL_TABLET | ORAL | Status: AC
  Filled 2014-06-22: qty 1

## 2014-06-22 NOTE — Discharge Instructions (Signed)
Abdominal Pain, Women °Abdominal (stomach, pelvic, or belly) pain can be caused by many things. It is important to tell your doctor: °· The location of the pain. °· Does it come and go or is it present all the time? °· Are there things that start the pain (eating certain foods, exercise)? °· Are there other symptoms associated with the pain (fever, nausea, vomiting, diarrhea)? °All of this is helpful to know when trying to find the cause of the pain. °CAUSES  °· Stomach: virus or bacteria infection, or ulcer. °· Intestine: appendicitis (inflamed appendix), regional ileitis (Crohn's disease), ulcerative colitis (inflamed colon), irritable bowel syndrome, diverticulitis (inflamed diverticulum of the colon), or cancer of the stomach or intestine. °· Gallbladder disease or stones in the gallbladder. °· Kidney disease, kidney stones, or infection. °· Pancreas infection or cancer. °· Fibromyalgia (pain disorder). °· Diseases of the female organs: °¨ Uterus: fibroid (non-cancerous) tumors or infection. °¨ Fallopian tubes: infection or tubal pregnancy. °¨ Ovary: cysts or tumors. °¨ Pelvic adhesions (scar tissue). °¨ Endometriosis (uterus lining tissue growing in the pelvis and on the pelvic organs). °¨ Pelvic congestion syndrome (female organs filling up with blood just before the menstrual period). °¨ Pain with the menstrual period. °¨ Pain with ovulation (producing an egg). °¨ Pain with an IUD (intrauterine device, birth control) in the uterus. °¨ Cancer of the female organs. °· Functional pain (pain not caused by a disease, may improve without treatment). °· Psychological pain. °· Depression. °DIAGNOSIS  °Your doctor will decide the seriousness of your pain by doing an examination. °· Blood tests. °· X-rays. °· Ultrasound. °· CT scan (computed tomography, special type of X-ray). °· MRI (magnetic resonance imaging). °· Cultures, for infection. °· Barium enema (dye inserted in the large intestine, to better view it with  X-rays). °· Colonoscopy (looking in intestine with a lighted tube). °· Laparoscopy (minor surgery, looking in abdomen with a lighted tube). °· Major abdominal exploratory surgery (looking in abdomen with a large incision). °TREATMENT  °The treatment will depend on the cause of the pain.  °· Many cases can be observed and treated at home. °· Over-the-counter medicines recommended by your caregiver. °· Prescription medicine. °· Antibiotics, for infection. °· Birth control pills, for painful periods or for ovulation pain. °· Hormone treatment, for endometriosis. °· Nerve blocking injections. °· Physical therapy. °· Antidepressants. °· Counseling with a psychologist or psychiatrist. °· Minor or major surgery. °HOME CARE INSTRUCTIONS  °· Do not take laxatives, unless directed by your caregiver. °· Take over-the-counter pain medicine only if ordered by your caregiver. Do not take aspirin because it can cause an upset stomach or bleeding. °· Try a clear liquid diet (broth or water) as ordered by your caregiver. Slowly move to a bland diet, as tolerated, if the pain is related to the stomach or intestine. °· Have a thermometer and take your temperature several times a day, and record it. °· Bed rest and sleep, if it helps the pain. °· Avoid sexual intercourse, if it causes pain. °· Avoid stressful situations. °· Keep your follow-up appointments and tests, as your caregiver orders. °· If the pain does not go away with medicine or surgery, you may try: °¨ Acupuncture. °¨ Relaxation exercises (yoga, meditation). °¨ Group therapy. °¨ Counseling. °SEEK MEDICAL CARE IF:  °· You notice certain foods cause stomach pain. °· Your home care treatment is not helping your pain. °· You need stronger pain medicine. °· You want your IUD removed. °· You feel faint or   lightheaded. °· You develop nausea and vomiting. °· You develop a rash. °· You are having side effects or an allergy to your medicine. °SEEK IMMEDIATE MEDICAL CARE IF:  °· Your  pain does not go away or gets worse. °· You have a fever. °· Your pain is felt only in portions of the abdomen. The right side could possibly be appendicitis. The left lower portion of the abdomen could be colitis or diverticulitis. °· You are passing blood in your stools (bright red or black tarry stools, with or without vomiting). °· You have blood in your urine. °· You develop chills, with or without a fever. °· You pass out. °MAKE SURE YOU:  °· Understand these instructions. °· Will watch your condition. °· Will get help right away if you are not doing well or get worse. °Document Released: 01/04/2007 Document Revised: 07/24/2013 Document Reviewed: 01/24/2009 °[employer]® Patient Information ©2015 [employer]. This information is not intended to replace advice given to you by your health care provider. Make sure you discuss any questions you have with your health care provider. ° °

## 2014-06-22 NOTE — ED Provider Notes (Signed)
CSN: [id number]     Arrival date & time 06/22/14  4098 History   First MD Initiated Contact with Patient 06/22/14 [phone]     Chief Complaint  Patient presents with  . Abdominal Pain     (Consider location/radiation/quality/duration/timing/severity/associated sxs/prior Treatment) Patient is a 44 y.o. female presenting with abdominal pain.  Abdominal Pain Pain location:  Suprapubic, LLQ, RLQ and LUQ Pain quality: cramping   Pain radiates to:  Does not radiate Pain severity:  Moderate Onset quality:  Gradual Duration:  3 weeks Timing:  Constant Progression:  Waxing and waning Chronicity:  New Context comment:  Treated for PID with doxycycline Relieved by:  Nothing Worsened by:  Palpation Ineffective treatments:  None tried Associated symptoms: diarrhea   Associated symptoms: no anorexia, no chest pain, no constipation, no dysuria, no nausea, no shortness of breath, no sore throat, no vaginal bleeding, no vaginal discharge and no vomiting     Past Medical History  Diagnosis Date  . PID (acute pelvic inflammatory disease)    History reviewed. No pertinent past surgical history. No family history on file. History  Substance Use Topics  . Smoking status: Current Every Day Smoker -- 0.50 packs/day    Types: Cigarettes  . Smokeless tobacco: Never Used  . Alcohol Use: No   OB History    No data available     Review of Systems  HENT: Negative for sore throat.   Respiratory: Negative for shortness of breath.   Cardiovascular: Negative for chest pain.  Gastrointestinal: Positive for abdominal pain and diarrhea. Negative for nausea, vomiting, constipation and anorexia.  Genitourinary: Negative for dysuria, vaginal bleeding and vaginal discharge.  All other systems reviewed and are negative.     Allergies  Peanut-containing drug products and Penicillins  Home Medications   Prior to Admission medications   Medication Sig Start Date End Date Taking? Authorizing Provider   ibuprofen (ADVIL,MOTRIN) 200 MG tablet Take 200 mg by mouth every 6 (six) hours as needed.    Historical Provider, MD  traMADol (ULTRAM) 50 MG tablet Take 1 tablet (50 mg total) by mouth every 6 (six) hours as needed. 06/22/14   Mirian Mo, MD   BP 135/78 mmHg  Pulse 74  Temp(Src) 98.2 F (36.8 C) (Oral)  Resp 16  Ht  (1.727 m)  Wt 140 lb (63.504 kg)  BMI 21.29 kg/m2  SpO2 100%  LMP 06/05/2014 Physical Exam  Constitutional: She is oriented to person, place, and time. She appears well-developed and well-nourished.  HENT:  Head: Normocephalic and atraumatic.  Right Ear: External ear normal.  Left Ear: External ear normal.  Eyes: Conjunctivae and EOM are normal. Pupils are equal, round, and reactive to light.  Neck: Normal range of motion. Neck supple.  Cardiovascular: Normal rate, regular rhythm, normal heart sounds and intact distal pulses.   Pulmonary/Chest: Effort normal and breath sounds normal.  Abdominal: Soft. Bowel sounds are normal. There is generalized tenderness.  Musculoskeletal: Normal range of motion.  Neurological: She is alert and oriented to person, place, and time.  Skin: Skin is warm and dry.  Vitals reviewed.   ED Course  Procedures (including critical care time) Labs Review Labs Reviewed  COMPREHENSIVE METABOLIC PANEL - Abnormal; Notable for the following:    Potassium 3.2 (*)    Alkaline Phosphatase 35 (*)    Total Bilirubin 0.1 (*)    All other components within normal limits  CBC WITH DIFFERENTIAL/PLATELET - Abnormal; Notable for the following:    Lymphs  Abs 4.5 (*)    All other components within normal limits  PREGNANCY, URINE  URINALYSIS, ROUTINE W REFLEX MICROSCOPIC    Imaging Review US Transvaginal Non-ob  06/22/2014   CLINICAL DATA:  Pelvic pain for 1 month, recent PID  EXAM: TRANSABDOMINAL AND TRANSVAGINAL ULTRASOUND OF PELVIS  TECHNIQUE: Both transabdominal and transvaginal ultrasound examinations of the pelvis were performed.  Transabdominal technique was performed for global imaging of the pelvis including uterus, ovaries, adnexal regions, and pelvic cul-de-sac. It was necessary to proceed with endovaginal exam following the transabdominal exam to visualize the ovaries.  COMPARISON:  CT scan same day  FINDINGS: Uterus  Measurements: 8.9 by 4 x 5.1 cm. No fibroids are noted.  Endometrium  Thickness: 11 mm.  No focal abnormality visualized.  Right ovary  Measurements: 3.6 x 1.9 x 2.1 cm. Mild complex cyst measures 1.5 x 1.6 cm.  Left ovary  Measurements: 3.3 x 2.5 x 3 cm. A simple left ovarian cyst measures 2.4 by 2.3 cm.  Other findings  Small pelvic free fluid is noted.  IMPRESSION: 1. Unremarkable uterus. No fibroids are noted. Normal endometrial stripe thickness. 2. Mild complex cyst right ovary measures 1.6 cm. No definite increase peripheral flow to suggest an abscess. Simple cyst in left ovary measures 2.4 cm. No adnexal mass. Small pelvic free fluid.   Electronically Signed   By: Natasha Mead M.D.   On: 06/22/2014 10:02   US Pelvis Complete  06/22/2014   CLINICAL DATA:  Pelvic pain for 1 month, recent PID  EXAM: TRANSABDOMINAL AND TRANSVAGINAL ULTRASOUND OF PELVIS  TECHNIQUE: Both transabdominal and transvaginal ultrasound examinations of the pelvis were performed. Transabdominal technique was performed for global imaging of the pelvis including uterus, ovaries, adnexal regions, and pelvic cul-de-sac. It was necessary to proceed with endovaginal exam following the transabdominal exam to visualize the ovaries.  COMPARISON:  CT scan same day  FINDINGS: Uterus  Measurements: 8.9 by 4 x 5.1 cm. No fibroids are noted.  Endometrium  Thickness: 11 mm.  No focal abnormality visualized.  Right ovary  Measurements: 3.6 x 1.9 x 2.1 cm. Mild complex cyst measures 1.5 x 1.6 cm.  Left ovary  Measurements: 3.3 x 2.5 x 3 cm. A simple left ovarian cyst measures 2.4 by 2.3 cm.  Other findings  Small pelvic free fluid is noted.  IMPRESSION: 1.  Unremarkable uterus. No fibroids are noted. Normal endometrial stripe thickness. 2. Mild complex cyst right ovary measures 1.6 cm. No definite increase peripheral flow to suggest an abscess. Simple cyst in left ovary measures 2.4 cm. No adnexal mass. Small pelvic free fluid.   Electronically Signed   By: Natasha Mead M.D.   On: 06/22/2014 10:02   Ct Abdomen Pelvis W Contrast  06/22/2014   CLINICAL DATA:  44 year old female with 3 week history of pelvic pain and tenderness which has not responded to outpatient oral antibiotic therapy for an initial diagnosis of pelvic inflammatory disease.  EXAM: CT ABDOMEN AND PELVIS WITH CONTRAST  TECHNIQUE: Multidetector CT imaging of the abdomen and pelvis was performed using the standard protocol following bolus administration of intravenous contrast.  CONTRAST:  OMNIPAQUE IOHEXOL 300 MG/ML  SOLN  COMPARISON:  None.  FINDINGS: Lower Chest: The lung bases are clear. Visualized cardiac structures are within normal limits for size. No pericardial effusion. Unremarkable visualized distal thoracic esophagus.  Abdomen: Unremarkable CT appearance of the stomach, duodenum, spleen, adrenal glands and pancreas. Normal hepatic contour and morphology. No discrete hepatic lesion. Portal  vein is widely patent. Gallbladder is unremarkable. No intra or extrahepatic biliary ductal dilatation.  Unremarkable appearance of the bilateral kidneys. No focal solid lesion, hydronephrosis or nephrolithiasis. Circumscribed low-attenuation 7 mm lesion in the upper pole of the left kidney is too small for accurate characterization but statistically highly likely a benign cyst.  No evidence of obstruction or focal bowel wall thickening. Normal appendix in the right lower quadrant. The terminal ileum is unremarkable.  Pelvis: The endometrial canal in is fluid-filled which is not unexpected in a reproductive age female. 2.4 cm water attenuation cyst affiliated with the left ovary. The right ovary is  somewhat indistinct in appearance in difficult to separate from closely opposed adjacent loops of small bowel. No definite tubo-ovarian abscess. There is small volume free fluid which is likely physiologic in a reproductive age female. Unremarkable appearance of the bladder.  Bones/Soft Tissues: No acute fracture or aggressive appearing lytic or blastic osseous lesion.  Vascular: No significant atherosclerotic vascular disease, aneurysmal dilatation or acute abnormality.  IMPRESSION: 1. No definite acute process in the abdomen or pelvis. 2. The right ovary is somewhat indistinct and difficult to separate from adjacent closely opposed loops of small bowel. This is likely incidental, however if the patient's pelvic pain localizes to the right, this appearance could represent tubo-ovarian inflammation in the setting of PID. No definite focal fluid collection to suggest tubo-ovarian abscess. 3. Endometrial cavity filled with fluid and small volume fluid in the anatomic pelvis are not unexpected findings in the reproductive age female. 4. Simple 2.5 cm follicular cyst affiliated with the left ovary.   Electronically Signed   By: Malachy MoanHeath  McCullough M.D.   On: 06/22/2014 08:05     EKG Interpretation None      MDM   Final diagnoses:  Abdominal pain, acute  Cysts of both ovaries    44 y.o. female with pertinent PMH of recent visit for same pain presents with continued abd pain x 3 weeks.  No vaginal dc, and prior wu revealed little pathology.  Pt treated empirically for PID, was compliant with therapy.  On arrival today the pt is well appearing with exam as above.  Wu as above revealed likely ovarian cysts as pathology.  Discussed standard return precautions, small amount of tramadol given.  DC home to fu with pcp and gyn.    I have reviewed all laboratory and imaging studies if ordered as above  1. Cysts of both ovaries   2. Abdominal pain, acute         Mirian MoMatthew Tayja Manzer, MD 06/22/14 1034

## 2014-06-22 NOTE — ED Notes (Signed)
Patient transported to Ultrasound 

## 2014-06-22 NOTE — ED Notes (Signed)
MD at bedside. 

## 2014-06-22 NOTE — ED Notes (Signed)
Abdominal pain x 3 weeks and recently treated for PId and completed PO antibiotics.  States she isn't improving.

## 2015-09-26 IMAGING — CT CT ABD-PELV W/ CM
2 of 5 series · 14 of 46 positions shown, 16 images · IV contrast (omnipaque)
Comparison: None.

CLINICAL DATA: 43-year-old female with 3 week history of pelvic
pain and tenderness which has not responded to outpatient oral
antibiotic therapy for an initial diagnosis of pelvic inflammatory
disease.

EXAM:
CT ABDOMEN AND PELVIS WITH CONTRAST
TECHNIQUE: Multidetector CT imaging of the abdomen and pelvis was performed
using the standard protocol following bolus administration of
intravenous contrast.
CONTRAST:  100mL OMNIPAQUE IOHEXOL 300 MG/ML  SOLN

[Series 2: abd/pelvis 5.0 b31f · axial · 0.56mm/px · z∈[-549,-129]mm · 11 of 96 slices shown, 13 images]
[im 6/96  soft-tissue]
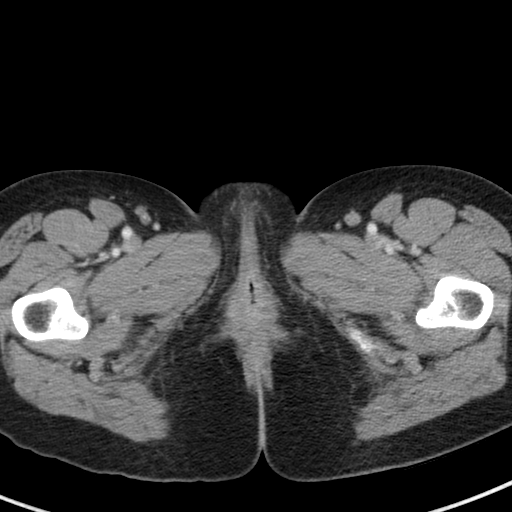
[im 6/96  bone]
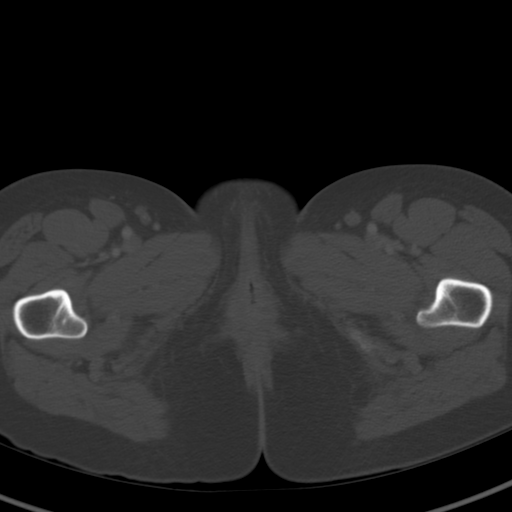
[im 16/96  soft-tissue]
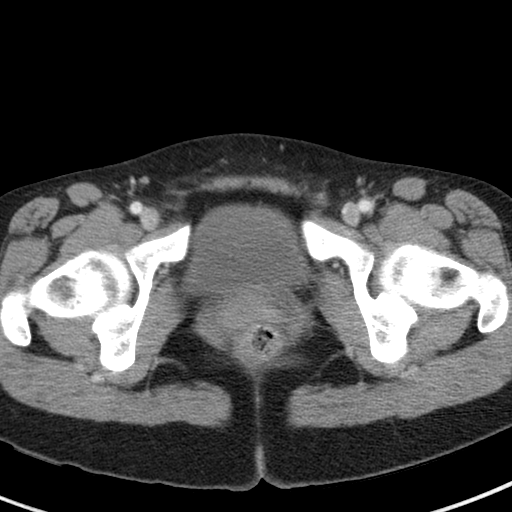
[im 22/96  soft-tissue]
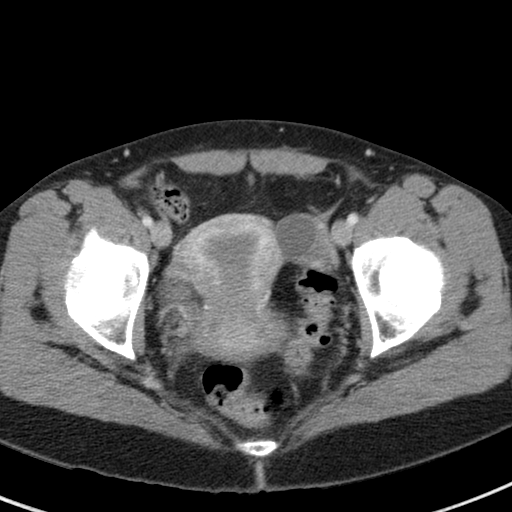
[im 32/96  soft-tissue]
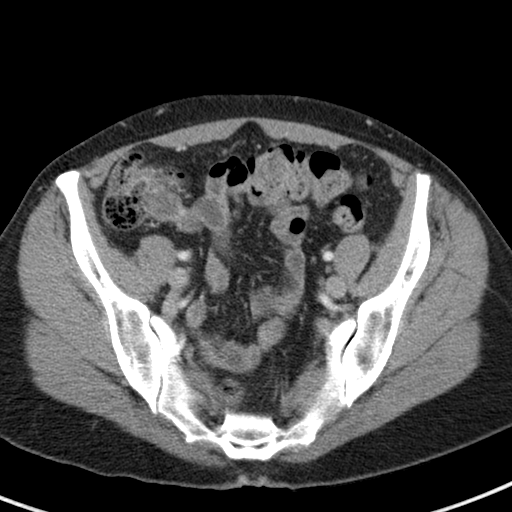
[im 37/96  soft-tissue]
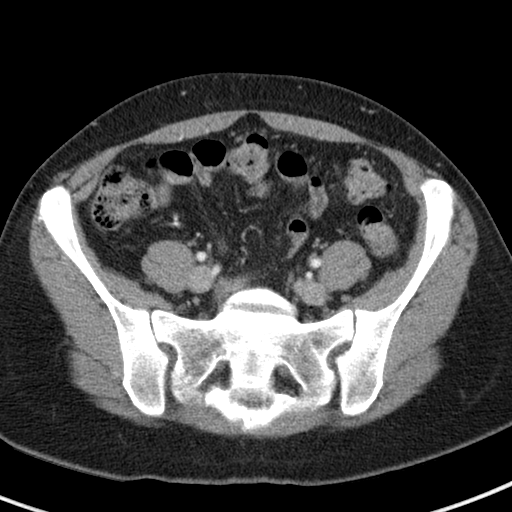
[im 48/96  soft-tissue]
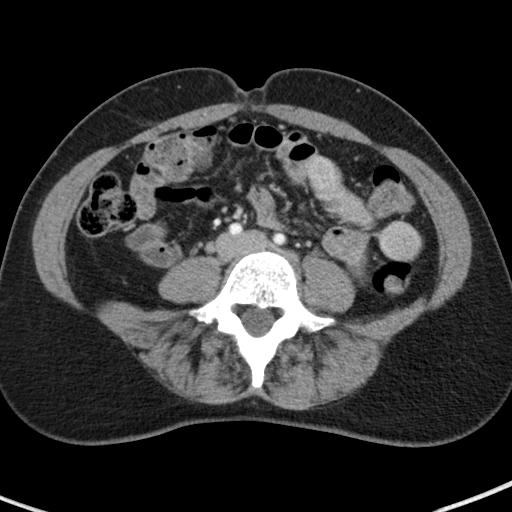
[im 59/96  soft-tissue]
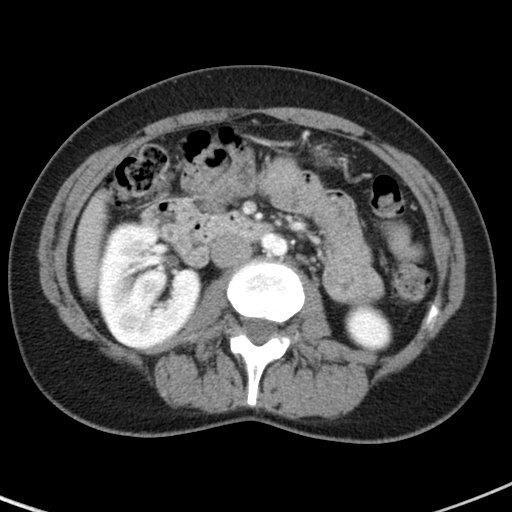
[im 64/96  soft-tissue]
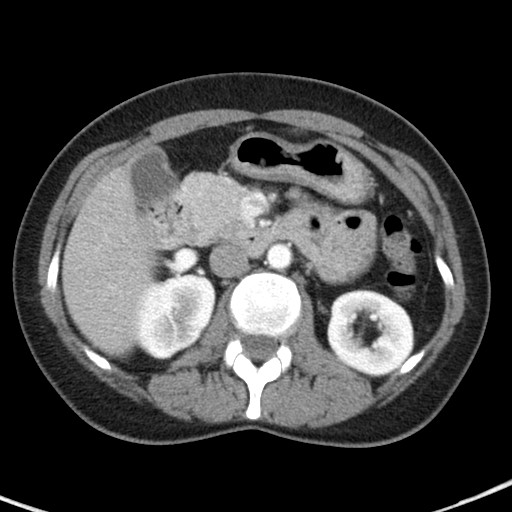
[im 74/96  soft-tissue]
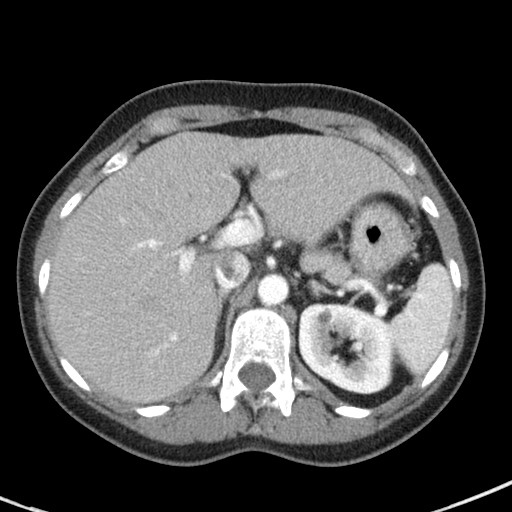
[im 74/96  bone]
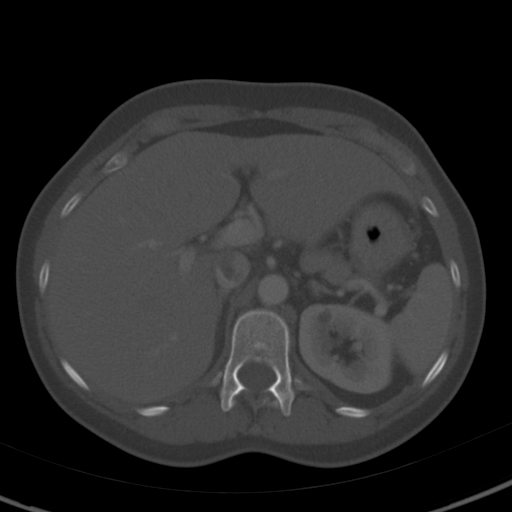
[im 80/96  soft-tissue]
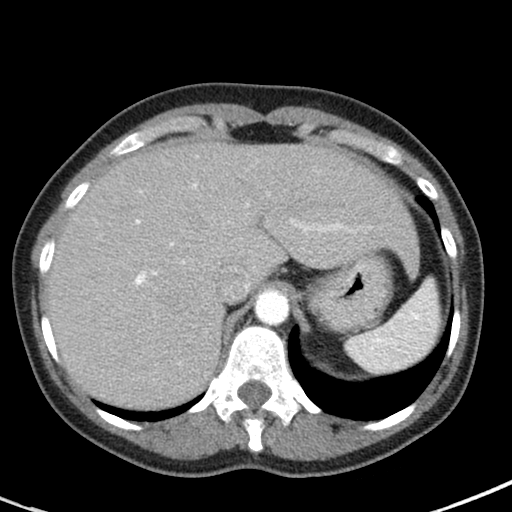
[im 90/96  soft-tissue]
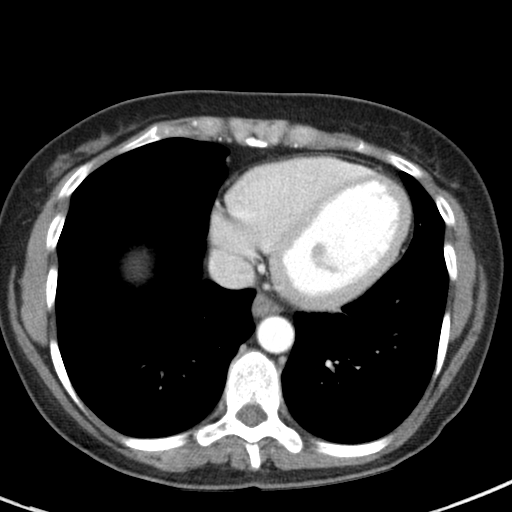

[Series 5: abd/pelvis 3.0 coronal · coronal · 0.59mm/px · 3 of 78 slices shown]
[im 26/78  soft-tissue]
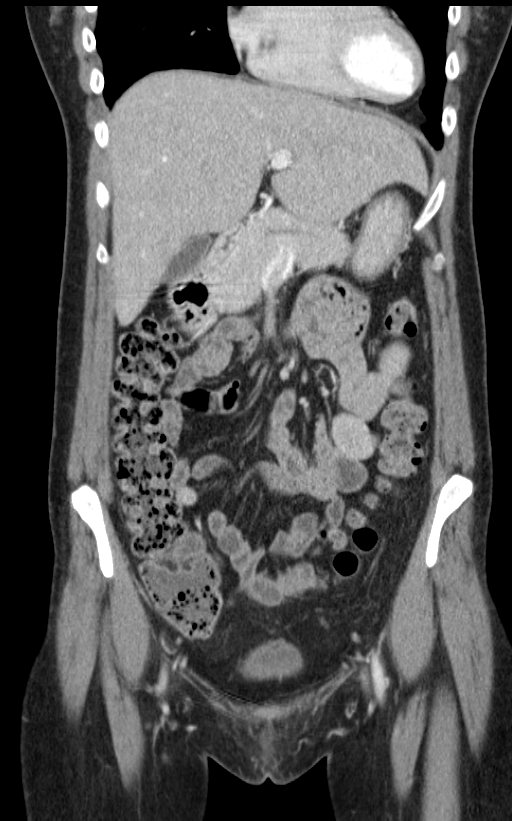
[im 35/78  soft-tissue]
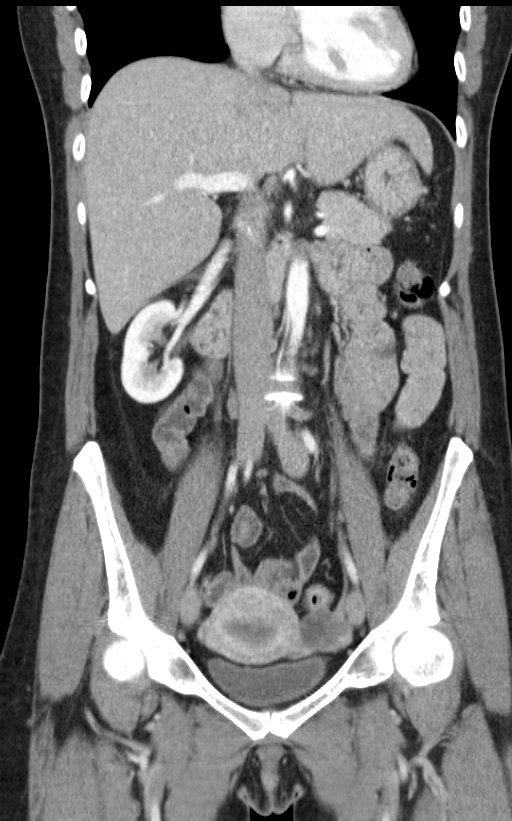
[im 43/78  soft-tissue]
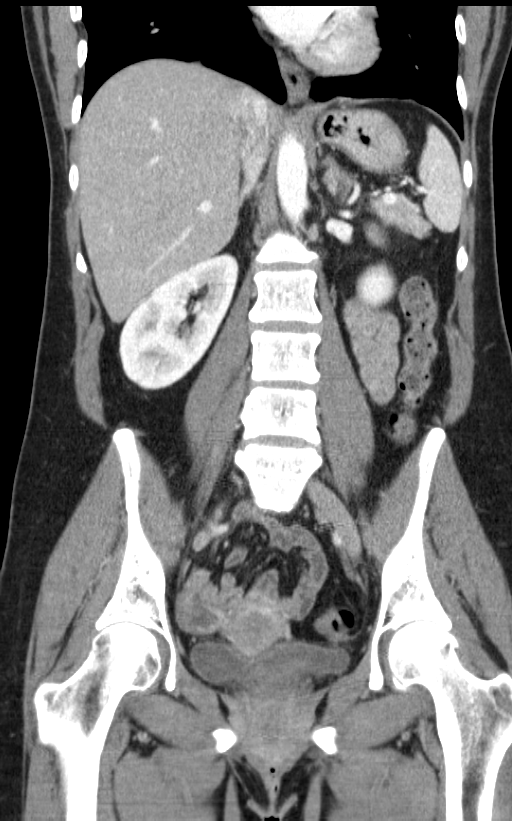

[14 of 46 positions shown; findings below may reference images not displayed]

FINDINGS: Lower Chest: The lung bases are clear. Visualized cardiac structures
are within normal limits for size. No pericardial effusion.
Unremarkable visualized distal thoracic esophagus.

Abdomen: Unremarkable CT appearance of the stomach, duodenum,
spleen, adrenal glands and pancreas. Normal hepatic contour and
morphology. No discrete hepatic lesion. Portal vein is widely
patent. Gallbladder is unremarkable. No intra or extrahepatic
biliary ductal dilatation.

Unremarkable appearance of the bilateral kidneys. No focal solid
lesion, hydronephrosis or nephrolithiasis. Circumscribed
low-attenuation 7 mm lesion in the upper pole of the left kidney is
too small for accurate characterization but statistically highly
likely a benign cyst.

No evidence of obstruction or focal bowel wall thickening. Normal
appendix in the right lower quadrant. The terminal ileum is
unremarkable.

Pelvis: The endometrial canal in is fluid-filled which is not
unexpected in a reproductive age female. 2.4 cm water attenuation
cyst affiliated with the left ovary. The right ovary is somewhat
indistinct in appearance in difficult to separate from closely
opposed adjacent loops of small bowel. No definite tubo-ovarian
abscess. There is small volume free fluid which is likely
physiologic in a reproductive age female. Unremarkable appearance of
the bladder.

Bones/Soft Tissues: No acute fracture or aggressive appearing lytic
or blastic osseous lesion.

Vascular: No significant atherosclerotic vascular disease,
aneurysmal dilatation or acute abnormality.
IMPRESSION: 1. No definite acute process in the abdomen or pelvis.
2. The right ovary is somewhat indistinct and difficult to separate
from adjacent closely opposed loops of small bowel. This is likely
incidental, however if the patient's pelvic pain localizes to the
right, this appearance could represent tubo-ovarian inflammation in
the setting of PID. No definite focal fluid collection to suggest
tubo-ovarian abscess.
3. Endometrial cavity filled with fluid and small volume fluid in
the anatomic pelvis are not unexpected findings in the reproductive
age female.
4. Simple 2.5 cm follicular cyst affiliated with the left ovary.

## 2016-03-21 ENCOUNTER — Emergency Department (HOSPITAL_BASED_OUTPATIENT_CLINIC_OR_DEPARTMENT_OTHER)
Admission: EM | Admit: 2016-03-21 | Discharge: 2016-03-21 | Disposition: A | Discharge status: Home / Self Care | Payer: Self-pay | Attending: Emergency Medicine | Admitting: Emergency Medicine

## 2016-03-21 ENCOUNTER — Encounter (HOSPITAL_BASED_OUTPATIENT_CLINIC_OR_DEPARTMENT_OTHER): Payer: Self-pay | Admitting: *Deleted

## 2016-03-21 DIAGNOSIS — B3731 Acute candidiasis of vulva and vagina: Secondary | ICD-10-CM

## 2016-03-21 DIAGNOSIS — J45909 Unspecified asthma, uncomplicated: Secondary | ICD-10-CM | POA: Insufficient documentation

## 2016-03-21 DIAGNOSIS — Z791 Long term (current) use of non-steroidal anti-inflammatories (NSAID): Secondary | ICD-10-CM | POA: Insufficient documentation

## 2016-03-21 DIAGNOSIS — B373 Candidiasis of vulva and vagina: Secondary | ICD-10-CM | POA: Insufficient documentation

## 2016-03-21 DIAGNOSIS — Z79899 Other long term (current) drug therapy: Secondary | ICD-10-CM | POA: Insufficient documentation

## 2016-03-21 DIAGNOSIS — F17210 Nicotine dependence, cigarettes, uncomplicated: Secondary | ICD-10-CM | POA: Insufficient documentation

## 2016-03-21 HISTORY — DX: Unspecified asthma, uncomplicated: J45.909

## 2016-03-21 HISTORY — DX: Acute vaginitis: B96.89

## 2016-03-21 HISTORY — DX: Acute vaginitis: N76.0

## 2016-03-21 LAB — URINALYSIS, ROUTINE W REFLEX MICROSCOPIC
BILIRUBIN URINE: NEGATIVE
Glucose, UA: NEGATIVE mg/dL
Hgb urine dipstick: NEGATIVE
KETONES UR: NEGATIVE mg/dL
LEUKOCYTES UA: NEGATIVE
NITRITE: NEGATIVE
PROTEIN: NEGATIVE mg/dL
Specific Gravity, Urine: 1.029 (ref 1.005–1.030)
pH: 5 (ref 5.0–8.0)

## 2016-03-21 LAB — WET PREP, GENITAL
Clue Cells Wet Prep HPF POC: NONE SEEN
Sperm: NONE SEEN
Trich, Wet Prep: NONE SEEN

## 2016-03-21 LAB — PREGNANCY, URINE: PREG TEST UR: NEGATIVE

## 2016-03-21 MED ORDER — FLUCONAZOLE 150 MG PO TABS: ORAL_TABLET | ORAL | 0 refills | Status: DC

## 2016-03-21 MED ORDER — FLUCONAZOLE 50 MG PO TABS
150.0000 mg | ORAL_TABLET | Freq: Once | ORAL | Status: AC
  Administered 2016-03-21: 150 mg via ORAL
  Filled 2016-03-21: qty 1

## 2016-03-21 NOTE — ED Provider Notes (Signed)
[facility]   CSN: [id number] Arrival date & time: 03/21/16  0719     History   Chief Complaint Chief Complaint  Patient presents with  . Vaginal Discharge    HPI Cindy Bailey is a 45 y.o. female. Complaint is vaginal itching and discharge.  HPI vision was vaginal itching and discharge. Seen and evaluated by pelvic exam for some discharge after "cheating on my boyfriend". States she was told she had BV. Placed on Flagyl. Finished 4 days ago. Thick curd-like vaginal discharge with itching and burning since then. Used Monistat once. Not tolerated because of "burned"  Past Medical History:  Diagnosis Date  . Asthma   . Bacterial vaginosis   . PID (acute pelvic inflammatory disease)     There are no active problems to display for this patient.   History reviewed. No pertinent surgical history.  OB History    No data available       Home Medications    Prior to Admission medications   Medication Sig Start Date End Date Taking? Authorizing Provider  busPIRone (BUSPAR) 15 MG tablet Take 15 mg by mouth 2 (two) times daily.   Yes Historical Provider, MD  ibuprofen (ADVIL,MOTRIN) 200 MG tablet Take 200 mg by mouth every 6 (six) hours as needed.   Yes Historical Provider, MD  Multiple Vitamins-Minerals (MULTIVITAMIN ADULT PO) Take by mouth.   Yes Historical Provider, MD  Probiotic Product (SOLUBLE FIBER/PROBIOTICS PO) Take by mouth.   Yes Historical Provider, MD  fluconazole (DIFLUCAN) 150 MG tablet 1 dose in 5 days as needed for persistent discharge. 03/21/16   Rolland PorterMark Dejon Jungman, MD  traMADol (ULTRAM) 50 MG tablet Take 1 tablet (50 mg total) by mouth every 6 (six) hours as needed. 06/22/14   Mirian MoMatthew Gentry, MD    Family History No family history on file.  Social History Social History  Substance Use Topics  . Smoking status: Current Every Day Smoker    Packs/day: 0.50    Types: Cigarettes  . Smokeless tobacco: Never Used  . Alcohol use Yes   Comment: occ/monthly     Allergies   Peanut-containing drug products and Penicillins   Review of Systems Review of Systems  Constitutional: Negative for appetite change, chills, diaphoresis, fatigue and fever.  HENT: Negative for mouth sores, sore throat and trouble swallowing.   Eyes: Negative for visual disturbance.  Respiratory: Negative for cough, chest tightness, shortness of breath and wheezing.   Cardiovascular: Negative for chest pain.  Gastrointestinal: Negative for abdominal distention, abdominal pain, diarrhea, nausea and vomiting.  Endocrine: Negative for polydipsia, polyphagia and polyuria.  Genitourinary: Positive for vaginal discharge and vaginal pain. Negative for dysuria, frequency and hematuria.  Musculoskeletal: Negative for gait problem.  Skin: Negative for color change, pallor and rash.  Neurological: Negative for dizziness, syncope, light-headedness and headaches.  Hematological: Does not bruise/bleed easily.  Psychiatric/Behavioral: Negative for behavioral problems and confusion.     Physical Exam Updated Vital Signs BP 147/95 (BP Location: Right Arm)   Pulse 65   Temp 98 F (36.7 C) (Oral)   Resp 16   Ht 5\' 7"  (1.702 m)   Wt 150 lb (68 kg)   LMP 02/21/2016 (Approximate)   SpO2 100%   BMI 23.49 kg/m   Physical Exam  Constitutional: She is oriented to person, place, and time. She appears well-developed and well-nourished. No distress.  HENT:  Head: Normocephalic.  Eyes: Conjunctivae are normal. Pupils are equal, round, and reactive to light.  No scleral icterus.  Neck: Normal range of motion. Neck supple. No thyromegaly present.  Cardiovascular: Normal rate and regular rhythm.  Exam reveals no gallop and no friction rub.   No murmur heard. Pulmonary/Chest: Effort normal and breath sounds normal. No respiratory distress. She has no wheezes. She has no rales.  Abdominal: Soft. Bowel sounds are normal. She exhibits no distension. There is no  tenderness. There is no rebound.  Genitourinary:  Genitourinary Comments: Pelvic exam with curd-like discharge and mucosal erythema. No cervical motion tenderness. Cervix is not friable.  Musculoskeletal: Normal range of motion.  Neurological: She is alert and oriented to person, place, and time.  Skin: Skin is warm and dry. No rash noted.  Psychiatric: She has a normal mood and affect. Her behavior is normal.     ED Treatments / Results  Labs (all labs ordered are listed, but only abnormal results are displayed) Labs Reviewed  WET PREP, GENITAL - Abnormal; Notable for the following:       Result Value   Yeast Wet Prep HPF POC PRESENT (*)    WBC, Wet Prep HPF POC MANY (*)    All other components within normal limits  URINALYSIS, ROUTINE W REFLEX MICROSCOPIC  PREGNANCY, URINE  GC/CHLAMYDIA PROBE AMP () NOT AT [facility]    EKG  EKG Interpretation None       Radiology No results found.  Procedures Procedures (including critical care time)  Medications Ordered in ED Medications  fluconazole (DIFLUCAN) tablet 150 mg (not administered)     Initial Impression / Assessment and Plan / ED Course  I have reviewed the triage vital signs and the nursing notes.  Pertinent labs & imaging results that were available during my care of the patient were reviewed by me and considered in my medical decision making (see chart for details).  Clinical Course     Emergency. No clue cells. Given Diflucan. Prescription for repeat if persists.  Final Clinical Impressions(s) / ED Diagnoses   Final diagnoses:  Vaginal yeast infection    New Prescriptions New Prescriptions   FLUCONAZOLE (DIFLUCAN) 150 MG TABLET    1 dose in 5 days as needed for persistent discharge.     Rolland PorterMark Tyshun Tuckerman, MD 03/21/16 [phone]

## 2016-03-21 NOTE — ED Triage Notes (Signed)
Pt reports vaginal itching and burning x2days. Reports completing Flagyl for bacterial vaginosis this past Monday. Denies urinary symptoms, fever. Reports treating with OTC Monistat yesterday with no relief.

## 2016-03-21 NOTE — Discharge Instructions (Signed)
Take second dose of Diflucan as needed in 5 days if any of your itching burning or discharge persists

## 2016-03-24 LAB — GC/CHLAMYDIA PROBE AMP (~~LOC~~) NOT AT ARMC
CHLAMYDIA, DNA PROBE: NEGATIVE
Neisseria Gonorrhea: NEGATIVE

## 2016-05-17 ENCOUNTER — Emergency Department (HOSPITAL_BASED_OUTPATIENT_CLINIC_OR_DEPARTMENT_OTHER)
Admission: EM | Admit: 2016-05-17 | Discharge: 2016-05-17 | Disposition: A | Discharge status: Home / Self Care | Payer: Self-pay | Attending: Emergency Medicine | Admitting: Emergency Medicine

## 2016-05-17 DIAGNOSIS — J45909 Unspecified asthma, uncomplicated: Secondary | ICD-10-CM | POA: Insufficient documentation

## 2016-05-17 DIAGNOSIS — N898 Other specified noninflammatory disorders of vagina: Secondary | ICD-10-CM | POA: Insufficient documentation

## 2016-05-17 DIAGNOSIS — F17210 Nicotine dependence, cigarettes, uncomplicated: Secondary | ICD-10-CM | POA: Insufficient documentation

## 2016-05-17 LAB — WET PREP, GENITAL
SPERM: NONE SEEN
TRICH WET PREP: NONE SEEN
YEAST WET PREP: NONE SEEN

## 2016-05-17 MED ORDER — FLUCONAZOLE 50 MG PO TABS
150.0000 mg | ORAL_TABLET | Freq: Once | ORAL | Status: AC
Start: 2016-05-17 — End: 2016-05-17
  Administered 2016-05-17: 12:00:00 150 mg via ORAL
  Filled 2016-05-17: qty 1

## 2016-05-17 MED ORDER — FLUCONAZOLE 200 MG PO TABS: 200.0000 mg | ORAL_TABLET | Freq: Every day | ORAL | 0 refills | Status: AC

## 2016-05-17 NOTE — ED Provider Notes (Signed)
[facility]-EMER[facility]ENC[facility] DEPT Provider Note   CSN: [id number] Arrival date & time: 05/17/16  4098     History   Chief Complaint No chief complaint on file.   HPI Cindy Bailey is a 46 y.o. female who presents to the ED with vaginal discharge and irritation that started 3 days ago. She reports having just got over BV last month. When she has BV she usually just has the d/c but this is different in that it is causing irritation. Patient reports being sexually active with one female partner x 4 years, unprotected sex, partner has had vasectomy. Patient has never been pregnant. No hx of STI's. LMP 05/07/16.  HPI  Past Medical History:  Diagnosis Date  . Asthma   . Bacterial vaginosis   . PID (acute pelvic inflammatory disease)     There are no active problems to display for this patient.   No past surgical history on file.  OB History    No data available       Home Medications    Prior to Admission medications   Medication Sig Start Date End Date Taking? Authorizing Provider  busPIRone (BUSPAR) 15 MG tablet Take 15 mg by mouth 2 (two) times daily.    Historical Provider, MD  fluconazole (DIFLUCAN) 200 MG tablet Take 1 tablet (200 mg total) by mouth daily. 05/17/16 05/18/16  Ziah Turvey Orlene Och, NP  ibuprofen (ADVIL,MOTRIN) 200 MG tablet Take 200 mg by mouth every 6 (six) hours as needed.    Historical Provider, MD  Multiple Vitamins-Minerals (MULTIVITAMIN ADULT PO) Take by mouth.    Historical Provider, MD  Probiotic Product (SOLUBLE FIBER/PROBIOTICS PO) Take by mouth.    Historical Provider, MD  traMADol (ULTRAM) 50 MG tablet Take 1 tablet (50 mg total) by mouth every 6 (six) hours as needed. 06/22/14   Mirian Mo, MD    Family History No family history on file.  Social History Social History  Substance Use Topics  . Smoking status: Current Every Day Smoker    Packs/day: 0.50    Types: Cigarettes  . Smokeless tobacco: Never Used  . Alcohol use Yes     Comment: occ/monthly      Allergies   Peanut-containing drug products and Penicillins   Review of Systems Review of Systems  Constitutional: Negative for fever.  HENT: Negative for congestion, ear pain and sore throat.   Respiratory: Negative for cough and shortness of breath.   Gastrointestinal: Negative for abdominal pain, diarrhea, nausea and vomiting.  Genitourinary: Positive for vaginal discharge and vaginal pain. Negative for dysuria, frequency, urgency and vaginal bleeding.  Musculoskeletal: Negative for gait problem.  Skin: Negative for rash.  Psychiatric/Behavioral: The patient is not nervous/anxious.      Physical Exam Updated Vital Signs BP 153/83 (BP Location: Right Arm)   Pulse 80   Temp 98.7 F (37.1 C) (Oral)   Resp 16   Ht 5\' 8"  (1.727 m)   Wt 68 kg   SpO2 98%   BMI 22.81 kg/m   Physical Exam  Constitutional: She is oriented to person, place, and time. She appears well-developed and well-nourished. No distress.  HENT:  Head: Normocephalic.  Eyes: EOM are normal.  Neck: Neck supple.  Cardiovascular: Normal rate and regular rhythm.   Pulmonary/Chest: Effort normal and breath sounds normal.  Abdominal: Soft. There is no tenderness.  Genitourinary:  Genitourinary Comments: External genitalia without lesions. Mucous d/c vaginal vault. Cervix without erythema, no CMT, no adnexal tenderness, uterus not enlarged.  Musculoskeletal: Normal range of motion.  Neurological: She is alert and oriented to person, place, and time. No cranial nerve deficit.  Skin: Skin is warm and dry.  Psychiatric: She has a normal mood and affect. Her behavior is normal.  Nursing note and vitals reviewed.    ED Treatments / Results  Labs (all labs ordered are listed, but only abnormal results are displayed) Labs Reviewed  WET PREP, GENITAL - Abnormal; Notable for the following:       Result Value   Clue Cells Wet Prep HPF POC PRESENT (*)    WBC, Wet Prep HPF POC MODERATE (*)    All other  components within normal limits  GC/CHLAMYDIA PROBE AMP ([place]) NOT AT [facility]    Radiology No results found.  Procedures Procedures (including critical care time)  Medications Ordered in ED Medications  fluconazole (DIFLUCAN) tablet 150 mg (150 mg Oral Given 05/17/16 1223)     Initial Impression / Assessment and Plan / ED Course  I have reviewed the triage vital signs and the nursing notes.  Pertinent lab results that were available during my care of the patient were reviewed by me and considered in my medical decision making (see chart for details).   Final Clinical Impressions(s) / ED Diagnoses  46 y.o. female with vaginal itching stable for d/c without abdominal or pelvic pain and no concern for PID. Will treat for clinical signs of monilia and she will f/u with GYN.  Final diagnoses:  Vaginal itching    New Prescriptions Discharge Medication List as of 05/17/2016 12:02 PM       Janne NapoleonHope M Jebadiah Imperato, NP 05/18/16 1729    Arby BarretteMarcy Pfeiffer, MD 05/19/16 [phone]

## 2016-05-17 NOTE — ED Notes (Signed)
Pt ambulating to restroom with specimen cup.

## 2016-05-17 NOTE — ED Notes (Signed)
Pelvic cart at bedside. 

## 2016-05-17 NOTE — Discharge Instructions (Signed)
Change back to the dove soap you were using and stop the other products.

## 2016-05-18 LAB — GC/CHLAMYDIA PROBE AMP (~~LOC~~) NOT AT ARMC
CHLAMYDIA, DNA PROBE: NEGATIVE
NEISSERIA GONORRHEA: NEGATIVE

## 2016-05-26 ENCOUNTER — Encounter (HOSPITAL_BASED_OUTPATIENT_CLINIC_OR_DEPARTMENT_OTHER): Payer: Self-pay | Admitting: *Deleted

## 2016-05-26 ENCOUNTER — Emergency Department (HOSPITAL_BASED_OUTPATIENT_CLINIC_OR_DEPARTMENT_OTHER)
Admission: EM | Admit: 2016-05-26 | Discharge: 2016-05-26 | Disposition: A | Discharge status: Home / Self Care | Payer: Self-pay | Attending: Emergency Medicine | Admitting: Emergency Medicine

## 2016-05-26 DIAGNOSIS — N390 Urinary tract infection, site not specified: Secondary | ICD-10-CM | POA: Insufficient documentation

## 2016-05-26 DIAGNOSIS — J45909 Unspecified asthma, uncomplicated: Secondary | ICD-10-CM | POA: Insufficient documentation

## 2016-05-26 DIAGNOSIS — Z79899 Other long term (current) drug therapy: Secondary | ICD-10-CM | POA: Insufficient documentation

## 2016-05-26 DIAGNOSIS — F17210 Nicotine dependence, cigarettes, uncomplicated: Secondary | ICD-10-CM | POA: Insufficient documentation

## 2016-05-26 HISTORY — DX: Anxiety disorder, unspecified: F41.9

## 2016-05-26 LAB — WET PREP, GENITAL
Clue Cells Wet Prep HPF POC: NONE SEEN
Sperm: NONE SEEN
Trich, Wet Prep: NONE SEEN
Yeast Wet Prep HPF POC: NONE SEEN

## 2016-05-26 LAB — PREGNANCY, URINE: Preg Test, Ur: NEGATIVE

## 2016-05-26 LAB — URINALYSIS, ROUTINE W REFLEX MICROSCOPIC
Bilirubin Urine: NEGATIVE
Glucose, UA: NEGATIVE mg/dL
Ketones, ur: NEGATIVE mg/dL
Nitrite: NEGATIVE
Protein, ur: NEGATIVE mg/dL
Specific Gravity, Urine: 1.019 (ref 1.005–1.030)
pH: 5.5 (ref 5.0–8.0)

## 2016-05-26 LAB — URINALYSIS, MICROSCOPIC (REFLEX)

## 2016-05-26 MED ORDER — CEPHALEXIN 500 MG PO CAPS: 500.0000 mg | ORAL_CAPSULE | Freq: Two times a day (BID) | ORAL | 0 refills | Status: DC

## 2016-05-26 MED FILL — CEPHALEXIN 500 MG CAPSULE: 500 | 7 days supply | Qty: 14 | Fill #0

## 2016-05-26 NOTE — ED Triage Notes (Signed)
Pt also requests to be checked to see if yeast infection has cleared up. Voices concern of frequent episodes of BV

## 2016-05-26 NOTE — ED Triage Notes (Signed)
Pt reports recent yeast infection. States has been having UTI sx since Saturday. C/o back pain, dysuria, frequency, burning

## 2016-05-26 NOTE — ED Provider Notes (Signed)
[facility]   CSN: [id number] Arrival date & time: 05/26/16  1308     History   Chief Complaint Chief Complaint  Patient presents with  . Dysuria    HPI Cindy Bailey is a 46 y.o. female with history of bacterial vaginosis and recent treatment of vaginal candidiasis who presents with a four-day history of urinary frequency, dysuria, intermittent suprapubic pressure, and intermittent low back pain. Patient denies any vaginal discharge or bleeding, however she would like to know for yeast infection has cleared up considering she has been having recurrent vaginal infections. Patient denies any fevers, chest pain, shortness of breath, nausea, vomiting. Patient is cranberry juice and Azo at home for her symptoms without relief. Patient is sexually active with one partner. She does not use protection. Her partner has a vasectomy. She has never been pregnant. Patient completed her treatment with Diflucan with an additional dose given by a friend.  HPI  Past Medical History:  Diagnosis Date  . Anxiety   . Asthma   . Bacterial vaginosis   . PID (acute pelvic inflammatory disease)     There are no active problems to display for this patient.   Past Surgical History:  Procedure Laterality Date  . WISDOM TOOTH EXTRACTION      OB History    No data available       Home Medications    Prior to Admission medications   Medication Sig Start Date End Date Taking? Authorizing Provider  busPIRone (BUSPAR) 15 MG tablet Take 15 mg by mouth 2 (two) times daily.   Yes Historical Provider, MD  ibuprofen (ADVIL,MOTRIN) 200 MG tablet Take 200 mg by mouth every 6 (six) hours as needed.   Yes Historical Provider, MD  Multiple Vitamins-Minerals (MULTIVITAMIN ADULT PO) Take by mouth.   Yes Historical Provider, MD  Probiotic Product (SOLUBLE FIBER/PROBIOTICS PO) Take by mouth.   Yes Historical Provider, MD  cephALEXin (KEFLEX) 500 MG capsule Take 1 capsule (500 mg total)  by mouth 2 (two) times daily. 05/26/16   Emi Holes, PA-C  traMADol (ULTRAM) 50 MG tablet Take 1 tablet (50 mg total) by mouth every 6 (six) hours as needed. 06/22/14   Mirian Mo, MD    Family History No family history on file.  Social History Social History  Substance Use Topics  . Smoking status: Current Every Day Smoker    Packs/day: 0.50    Types: Cigarettes  . Smokeless tobacco: Never Used  . Alcohol use Yes     Comment: occ/monthly     Allergies   Peanut-containing drug products and Penicillins   Review of Systems Review of Systems  Constitutional: Negative for chills and fever.  HENT: Negative for facial swelling and sore throat.   Respiratory: Negative for shortness of breath.   Cardiovascular: Negative for chest pain.  Gastrointestinal: Positive for abdominal pain (suprapubic). Negative for diarrhea, nausea and vomiting.  Genitourinary: Positive for dysuria and frequency. Negative for pelvic pain, vaginal bleeding, vaginal discharge and vaginal pain.  Musculoskeletal: Positive for back pain (low).  Skin: Negative for rash and wound.  Neurological: Negative for headaches.  Psychiatric/Behavioral: The patient is not nervous/anxious.      Physical Exam Updated Vital Signs BP 122/75 (BP Location: Right Arm)   Pulse 71   Temp 98.1 F (36.7 C) (Oral)   Resp 16   Ht 5\' 8"  (1.727 m)   Wt 68 kg   LMP 05/09/2016 (Exact Date)   SpO2 97%  BMI 22.81 kg/m   Physical Exam  Constitutional: She appears well-developed and well-nourished. No distress.  HENT:  Head: Normocephalic and atraumatic.  Mouth/Throat: Oropharynx is clear and moist. No oropharyngeal exudate.  Eyes: Conjunctivae are normal. Pupils are equal, round, and reactive to light. Right eye exhibits no discharge. Left eye exhibits no discharge. No scleral icterus.  Neck: Normal range of motion. Neck supple. No thyromegaly present.  Cardiovascular: Normal rate, regular rhythm, normal heart sounds  and intact distal pulses.  Exam reveals no gallop and no friction rub.   No murmur heard. Pulmonary/Chest: Effort normal and breath sounds normal. No stridor. No respiratory distress. She has no wheezes. She has no rales.  Abdominal: Soft. Bowel sounds are normal. She exhibits no distension. There is no tenderness. There is no rebound, no guarding and no CVA tenderness.  Genitourinary: Uterus normal. Cervix exhibits no motion tenderness. Right adnexum displays no tenderness and no fullness. Left adnexum displays no mass, no tenderness and no fullness. No bleeding in the vagina. Vaginal discharge (white, cottage cheese like discharge) found.  Musculoskeletal: She exhibits no edema.  Lymphadenopathy:    She has no cervical adenopathy.  Neurological: She is alert. Coordination normal.  Skin: Skin is warm and dry. No rash noted. She is not diaphoretic. No pallor.  Psychiatric: She has a normal mood and affect.  Nursing note and vitals reviewed.    ED Treatments / Results  Labs (all labs ordered are listed, but only abnormal results are displayed) Labs Reviewed  WET PREP, GENITAL - Abnormal; Notable for the following:       Result Value   WBC, Wet Prep HPF POC MANY (*)    All other components within normal limits  URINALYSIS, ROUTINE W REFLEX MICROSCOPIC - Abnormal; Notable for the following:    APPearance CLOUDY (*)    Hgb urine dipstick MODERATE (*)    Leukocytes, UA LARGE (*)    All other components within normal limits  URINALYSIS, MICROSCOPIC (REFLEX) - Abnormal; Notable for the following:    Bacteria, UA MANY (*)    Squamous Epithelial / LPF 0-5 (*)    All other components within normal limits  URINE CULTURE  PREGNANCY, URINE  RPR  HIV ANTIBODY (ROUTINE TESTING)  GC/CHLAMYDIA PROBE AMP ([place]) NOT AT [facility]    EKG  EKG Interpretation None       Radiology No results found.  Procedures Procedures (including critical care time)  Medications Ordered in  ED Medications - No data to display   Initial Impression / Assessment and Plan / ED Course  I have reviewed the triage vital signs and the nursing notes.  Pertinent labs & imaging results that were available during my care of the patient were reviewed by me and considered in my medical decision making (see chart for details).     Pt diagnosed with a UTI. Pt is afebrile, tachycardia, hypotension, or other signs of serious infection. UA shows moderate hematuria, large leukocytes, too numerous to count WBCs, many bacteria. Urine culture sent.Wet prep shows many WBCs. Considering no yeast found on slide and patient will be treated, will not treat again. Pt to be dc home with Keflex and instructions to follow up with PCP if symptoms persist. Discussed return precautions. Patient vitals stable throughout ED course and discharged in satisfactory condition. I discussed case with Dr. Karma Ganja who guided the patient's management and agrees with plan.  Final Clinical Impressions(s) / ED Diagnoses   Final diagnoses:  Lower urinary tract  infectious disease    New Prescriptions New Prescriptions   CEPHALEXIN (KEFLEX) 500 MG CAPSULE    Take 1 capsule (500 mg total) by mouth 2 (two) times daily.     Emi Holeslexandra M Wallace Gappa, PA-C 05/26/16 1012    Jerelyn ScottMartha Linker, MD 05/26/16 [phone]

## 2016-05-26 NOTE — Discharge Instructions (Signed)
Medications: Keflex  Treatment: Take Keflex twice daily for 1 week. Make sure to finish all this medication. Make sure to drink plenty of water. A urine culture has been sent of your urine and you will be called if there needs to be a change in your antibiotic. He will also be called if any of your STD testing returns back positive. You will need to follow-up with your OB/GYN or the health department for treatment, if positive.  Follow-up: Please follow-up and establish care with an OB/GYN by a call the number outlined below. Please return to the emergency department if you develop any new or worsening symptoms.

## 2016-05-26 NOTE — ED Notes (Signed)
ED Provider at bedside. 

## 2016-05-27 LAB — HIV ANTIBODY (ROUTINE TESTING W REFLEX): HIV Screen 4th Generation wRfx: NONREACTIVE

## 2016-05-27 LAB — RPR: RPR Ser Ql: NONREACTIVE

## 2016-05-27 LAB — GC/CHLAMYDIA PROBE AMP (~~LOC~~) NOT AT ARMC
Chlamydia: NEGATIVE
Neisseria Gonorrhea: NEGATIVE

## 2016-05-28 LAB — URINE CULTURE: Culture: >=100000 — AB

## 2016-05-29 ENCOUNTER — Telehealth: Payer: Self-pay | Admitting: *Deleted

## 2016-05-29 NOTE — Telephone Encounter (Signed)
Post ED Visit - Positive Culture Follow-up  Culture report reviewed by antimicrobial stewardship pharmacist:  []  Enzo BiNathan Batchelder, Pharm.D. []  Celedonio MiyamotoJeremy Frens, Pharm.D., BCPS []  Garvin FilaMike Maccia, Pharm.D. []  Georgina PillionElizabeth Martin, Pharm.D., BCPS []  [place] City[place]h Pham, [address] BoulevardPharm.D., BCPS, AAHIVP [x]  Estella HuskMichelle Turner, Pharm.D., BCPS, AAHIVP []  Tennis Mustassie Stewart, Pharm.D. []  Sherle Poeob Vincent, [place]D.  Positive urine culture Treated with Cephalexin, organism sensitive to the same and no further patient follow-up is required at this time.  Virl AxeRobertson, Daijanae Rafalski Talley 05/29/2016, 1:20 PM

## 2016-08-05 ENCOUNTER — Encounter (HOSPITAL_BASED_OUTPATIENT_CLINIC_OR_DEPARTMENT_OTHER): Payer: Self-pay

## 2016-08-05 ENCOUNTER — Emergency Department (HOSPITAL_BASED_OUTPATIENT_CLINIC_OR_DEPARTMENT_OTHER)
Admission: EM | Admit: 2016-08-05 | Discharge: 2016-08-05 | Disposition: A | Discharge status: Home / Self Care | Payer: Self-pay | Attending: Emergency Medicine | Admitting: Emergency Medicine

## 2016-08-05 DIAGNOSIS — N3000 Acute cystitis without hematuria: Secondary | ICD-10-CM | POA: Insufficient documentation

## 2016-08-05 DIAGNOSIS — J45909 Unspecified asthma, uncomplicated: Secondary | ICD-10-CM | POA: Insufficient documentation

## 2016-08-05 DIAGNOSIS — F17210 Nicotine dependence, cigarettes, uncomplicated: Secondary | ICD-10-CM | POA: Insufficient documentation

## 2016-08-05 LAB — URINALYSIS, ROUTINE W REFLEX MICROSCOPIC
BILIRUBIN URINE: NEGATIVE
GLUCOSE, UA: NEGATIVE mg/dL
Hgb urine dipstick: NEGATIVE
KETONES UR: NEGATIVE mg/dL
NITRITE: NEGATIVE
Protein, ur: NEGATIVE mg/dL
Specific Gravity, Urine: 1.027 (ref 1.005–1.030)
pH: 6 (ref 5.0–8.0)

## 2016-08-05 LAB — URINALYSIS, MICROSCOPIC (REFLEX)

## 2016-08-05 LAB — PREGNANCY, URINE: Preg Test, Ur: NEGATIVE

## 2016-08-05 MED ORDER — SULFAMETHOXAZOLE-TRIMETHOPRIM 800-160 MG PO TABS: 1.0000 | ORAL_TABLET | Freq: Two times a day (BID) | ORAL | 0 refills | Status: DC

## 2016-08-05 MED ORDER — SULFAMETHOXAZOLE-TRIMETHOPRIM 800-160 MG PO TABS
1.0000 | ORAL_TABLET | Freq: Once | ORAL | Status: AC
  Administered 2016-08-05: 1 via ORAL

## 2016-08-05 MED ORDER — SULFAMETHOXAZOLE-TRIMETHOPRIM 400-80 MG PO TABS
1.0000 | ORAL_TABLET | Freq: Two times a day (BID) | ORAL | Status: DC
  Filled 2016-08-05: qty 1

## 2016-08-05 MED ORDER — PHENAZOPYRIDINE HCL 100 MG PO TABS
100.0000 mg | ORAL_TABLET | Freq: Once | ORAL | Status: AC
  Administered 2016-08-05: 100 mg via ORAL
  Filled 2016-08-05: qty 1

## 2016-08-05 MED ORDER — SULFAMETHOXAZOLE-TRIMETHOPRIM 800-160 MG PO TABS
ORAL_TABLET | ORAL | Status: AC
  Administered 2016-08-05: 1 via ORAL
  Filled 2016-08-05: qty 1

## 2016-08-05 MED ORDER — PHENAZOPYRIDINE HCL 200 MG PO TABS: 200.0000 mg | ORAL_TABLET | Freq: Three times a day (TID) | ORAL | 0 refills | Status: DC

## 2016-08-05 MED FILL — SULFAMETHOXAZOLE/TMP DS TAB: 800-160 | 3 days supply | Qty: 6 | Fill #0

## 2016-08-05 NOTE — ED Triage Notes (Signed)
Pt c/o burning with urination and urinary frequency, bladder pain, thinks she has a UTI

## 2016-08-05 NOTE — ED Provider Notes (Signed)
[facility]   CSN: [id number] Arrival date & time: 08/05/16  4098     History   Chief Complaint Chief Complaint  Patient presents with  . Dysuria    HPI Cindy Cindy is a 46 y.o. female.Chief complaint is burning with urination  HPI UTI symptoms for 3 days. Most recent UTI 3 months ago. No hematuria. No nausea vomiting or flank pain. Frequency hesitancy and burning. Has been drinking water hoping to "flush it out" no improvement.  Past Medical History:  Diagnosis Date  . Anxiety   . Asthma   . Bacterial vaginosis   . PID (acute pelvic inflammatory disease)     There are no active problems to display for this patient.   Past Surgical History:  Procedure Laterality Date  . WISDOM TOOTH EXTRACTION      OB History    No data available       Home Medications    Prior to Admission medications   Medication Sig Start Date End Date Taking? Authorizing Provider  busPIRone (BUSPAR) 15 MG tablet Take 15 mg by mouth 2 (two) times daily.    [provider]  cephALEXin (KEFLEX) 500 MG capsule Take 1 capsule (500 mg total) by mouth 2 (two) times daily. 05/26/16   Law, Waylan Boga, PA-C  ibuprofen (ADVIL,MOTRIN) 200 MG tablet Take 200 mg by mouth every 6 (six) hours as needed.    [provider]  Multiple Vitamins-Minerals (MULTIVITAMIN ADULT PO) Take by mouth.    [provider]  phenazopyridine (PYRIDIUM) 200 MG tablet Take 1 tablet (200 mg total) by mouth 3 (three) times daily. 08/05/16   Rolland Porter, MD  Probiotic Product (SOLUBLE FIBER/PROBIOTICS PO) Take by mouth.    [provider]  sulfamethoxazole-trimethoprim (BACTRIM DS,SEPTRA DS) 800-160 MG tablet Take 1 tablet by mouth 2 (two) times daily. 08/05/16   Rolland Porter, MD  traMADol (ULTRAM) 50 MG tablet Take 1 tablet (50 mg total) by mouth every 6 (six) hours as needed. 06/22/14   Mirian Mo, MD    Family History No family history on file.  Social  History Social History  Substance Use Topics  . Smoking status: Current Every Day Smoker    Packs/day: 0.50    Types: Cigarettes  . Smokeless tobacco: Never Used  . Alcohol use Yes     Comment: occ/monthly     Allergies   Peanut-containing drug products and Penicillins   Review of Systems Review of Systems  Constitutional: Negative for appetite change, chills, diaphoresis, fatigue and fever.  HENT: Negative for mouth sores, sore throat and trouble swallowing.   Eyes: Negative for visual disturbance.  Respiratory: Negative for cough, chest tightness, shortness of breath and wheezing.   Cardiovascular: Negative for chest pain.  Gastrointestinal: Negative for abdominal distention, abdominal pain, diarrhea, nausea and vomiting.  Endocrine: Negative for polydipsia, polyphagia and polyuria.  Genitourinary: Positive for decreased urine volume, dysuria and frequency. Negative for hematuria and vaginal bleeding.  Musculoskeletal: Negative for gait problem.  Skin: Negative for color change, pallor and rash.  Neurological: Negative for dizziness, syncope, light-headedness and headaches.  Hematological: Does not bruise/bleed easily.  Psychiatric/Behavioral: Negative for behavioral problems and confusion.     Physical Exam Updated Vital Signs BP 136/66 (BP Location: Right Arm)   Pulse 70   Temp 98 F (36.7 C) (Oral)   Resp 17   Ht 5\' 8"  (1.727 m)   Wt 152 lb (68.9 kg)   LMP 07/30/2016  SpO2 97%   BMI 23.11 kg/m   Physical Exam  Constitutional: She is oriented to person, place, and time. She appears well-developed and well-nourished. No distress.  HENT:  Head: Normocephalic.  Eyes: Conjunctivae are normal. Pupils are equal, round, and reactive to light. No scleral icterus.  Neck: Normal range of motion. Neck supple. No thyromegaly present.  Cardiovascular: Normal rate and regular rhythm.  Exam reveals no gallop and no friction rub.   No murmur heard. Pulmonary/Chest: Effort  normal and breath sounds normal. No respiratory distress. She has no wheezes. She has no rales.  Abdominal: Soft. Bowel sounds are normal. She exhibits no distension. There is no tenderness. There is no rebound.  Suprapubic tenderness.  Musculoskeletal: Normal range of motion.  Neurological: She is alert and oriented to person, place, and time.  Skin: Skin is warm and dry. No rash noted.  Psychiatric: She has a normal mood and affect. Her behavior is normal.     ED Treatments / Results  Labs (all labs ordered are listed, but only abnormal results are displayed) Labs Reviewed  URINALYSIS, ROUTINE W REFLEX MICROSCOPIC - Abnormal; Notable for the following:       Result Value   APPearance CLOUDY (*)    Leukocytes, UA SMALL (*)    All other components within normal limits  URINALYSIS, MICROSCOPIC (REFLEX) - Abnormal; Notable for the following:    Bacteria, UA MANY (*)    Squamous Epithelial / LPF 0-5 (*)    Non Squamous Epithelial PRESENT (*)    All other components within normal limits  PREGNANCY, URINE    EKG  EKG Interpretation None       Radiology No results found.  Procedures Procedures (including critical care time)  Medications Ordered in ED Medications  sulfamethoxazole-trimethoprim (BACTRIM,SEPTRA) 400-80 MG per tablet 1 tablet (not administered)  phenazopyridine (PYRIDIUM) tablet 100 mg (not administered)     Initial Impression / Assessment and Plan / ED Course  I have reviewed the triage vital signs and the nursing notes.  Pertinent labs & imaging results that were available during my care of the patient were reviewed by me and considered in my medical decision making (see chart for details).    Positive UA. Consistent with simple cystitis. Plan Bactrim Pyridium.  Final Clinical Impressions(s) / ED Diagnoses   Final diagnoses:  Acute cystitis without hematuria    New Prescriptions New Prescriptions   PHENAZOPYRIDINE (PYRIDIUM) 200 MG TABLET     Take 1 tablet (200 mg total) by mouth 3 (three) times daily.   SULFAMETHOXAZOLE-TRIMETHOPRIM (BACTRIM DS,SEPTRA DS) 800-160 MG TABLET    Take 1 tablet by mouth 2 (two) times daily.     Rolland PorterJames, Kellyn Mccary, MD 08/05/16 [phone]

## 2016-11-18 ENCOUNTER — Emergency Department (HOSPITAL_BASED_OUTPATIENT_CLINIC_OR_DEPARTMENT_OTHER)
Admission: EM | Admit: 2016-11-18 | Discharge: 2016-11-18 | Disposition: A | Discharge status: Home / Self Care | Payer: Self-pay | Attending: Emergency Medicine | Admitting: Emergency Medicine

## 2016-11-18 ENCOUNTER — Encounter (HOSPITAL_BASED_OUTPATIENT_CLINIC_OR_DEPARTMENT_OTHER): Payer: Self-pay

## 2016-11-18 DIAGNOSIS — J45909 Unspecified asthma, uncomplicated: Secondary | ICD-10-CM | POA: Insufficient documentation

## 2016-11-18 DIAGNOSIS — Z79899 Other long term (current) drug therapy: Secondary | ICD-10-CM | POA: Insufficient documentation

## 2016-11-18 DIAGNOSIS — B9689 Other specified bacterial agents as the cause of diseases classified elsewhere: Secondary | ICD-10-CM

## 2016-11-18 DIAGNOSIS — N760 Acute vaginitis: Secondary | ICD-10-CM | POA: Insufficient documentation

## 2016-11-18 DIAGNOSIS — N898 Other specified noninflammatory disorders of vagina: Secondary | ICD-10-CM

## 2016-11-18 DIAGNOSIS — F17210 Nicotine dependence, cigarettes, uncomplicated: Secondary | ICD-10-CM | POA: Insufficient documentation

## 2016-11-18 LAB — WET PREP, GENITAL
SPERM: NONE SEEN
TRICH WET PREP: NONE SEEN
YEAST WET PREP: NONE SEEN

## 2016-11-18 LAB — URINALYSIS, ROUTINE W REFLEX MICROSCOPIC
Bilirubin Urine: NEGATIVE
Glucose, UA: NEGATIVE mg/dL
Hgb urine dipstick: NEGATIVE
KETONES UR: NEGATIVE mg/dL
LEUKOCYTES UA: NEGATIVE
NITRITE: NEGATIVE
PH: 5.5 (ref 5.0–8.0)
PROTEIN: NEGATIVE mg/dL
Specific Gravity, Urine: >1.03 — ABNORMAL HIGH (ref 1.005–1.030)

## 2016-11-18 LAB — PREGNANCY, URINE: Preg Test, Ur: NEGATIVE

## 2016-11-18 MED ORDER — METRONIDAZOLE 500 MG PO TABS: 500.0000 mg | ORAL_TABLET | Freq: Two times a day (BID) | ORAL | 0 refills | Status: DC

## 2016-11-18 MED ORDER — CEFTRIAXONE SODIUM 250 MG IJ SOLR
250.0000 mg | Freq: Once | INTRAMUSCULAR | Status: AC
  Administered 2016-11-18: 250 mg via INTRAMUSCULAR
  Filled 2016-11-18: qty 250

## 2016-11-18 MED ORDER — AZITHROMYCIN 250 MG PO TABS
1000.0000 mg | ORAL_TABLET | Freq: Once | ORAL | Status: AC
  Administered 2016-11-18: 1000 mg via ORAL
  Filled 2016-11-18: qty 4

## 2016-11-18 MED ORDER — GENTAMICIN SULFATE 40 MG/ML IJ SOLN
240.0000 mg | Freq: Once | INTRAMUSCULAR | Status: DC
Start: 2016-11-18 — End: 2016-11-18
  Filled 2016-11-18: qty 6

## 2016-11-18 MED FILL — metroNIDAZOLE 500 MG TABS: 500 | 7 days supply | Qty: 14 | Fill #0

## 2016-11-18 NOTE — ED Triage Notes (Signed)
C/o vaginal d/c x today-NAD-steady gait 

## 2016-11-18 NOTE — ED Provider Notes (Signed)
[facility] Provider [facility]   CSN: [id number] Arrival date & time: 11/18/16  1456     History   Chief Complaint Chief Complaint  Patient presents with  . Vaginal Discharge    HPI Cindy Bailey is a 46 y.o. female with h/o PID and recurrent BV presents to ED for evaluation of white vaginal discharge, itching and burning x 2 days. Thinks it is a yeast infection but has not tried monostat because it usually doesn't work.  Sexually active with men and women, only men now. Sexually active recently and with new partners. Allergic to latex and recently used a condom that "probably" had latex. Not consistent use of condoms with sexual contact.  Last STD test < 6 months, never had a positive tests. No fevers, nausea, vomiting, abdominal pain, dysuria, vaginal bleeding.  HPI  Past Medical History:  Diagnosis Date  . Anxiety   . Asthma   . Bacterial vaginosis   . PID (acute pelvic inflammatory disease)     There are no active problems to display for this patient.   Past Surgical History:  Procedure Laterality Date  . WISDOM TOOTH EXTRACTION      OB History    No data available       Home Medications    Prior to Admission medications   Medication Sig Start Date End Date Taking? Authorizing Provider  busPIRone (BUSPAR) 15 MG tablet Take 15 mg by mouth 2 (two) times daily.    [provider]  ibuprofen (ADVIL,MOTRIN) 200 MG tablet Take 200 mg by mouth every 6 (six) hours as needed.    [provider]  metroNIDAZOLE (FLAGYL) 500 MG tablet Take 1 tablet (500 mg total) by mouth 2 (two) times daily. 11/18/16   Liberty HandyGibbons, Claudia J, PA-C  Multiple Vitamins-Minerals (MULTIVITAMIN ADULT PO) Take by mouth.    [provider]  Probiotic Product (SOLUBLE FIBER/PROBIOTICS PO) Take by mouth.    [provider]    Family History No family history on file.  Social History Social History  Substance Use Topics  . Smoking status: Current  Every Day Smoker    Packs/day: 0.50    Types: Cigarettes  . Smokeless tobacco: Never Used  . Alcohol use Yes     Comment: occ     Allergies   Peanut-containing drug products and Penicillins   Review of Systems Review of Systems  Constitutional: Negative for chills, diaphoresis and fever.  Gastrointestinal: Negative for abdominal pain, diarrhea, nausea and vomiting.  Genitourinary: Positive for dysuria and vaginal discharge. Negative for difficulty urinating, flank pain, frequency, pelvic pain, urgency, vaginal bleeding and vaginal pain.  Musculoskeletal: Negative for back pain.  Skin: Negative for rash.     Physical Exam Updated Vital Signs BP 129/83 (BP Location: Left Arm)   Pulse 74   Temp 98.1 F (36.7 C) (Oral)   Resp 18   Ht 5\' 7"  (1.702 m)   Wt 70.8 kg (156 lb)   LMP 11/01/2016   SpO2 99%   BMI 24.43 kg/m   Physical Exam  Constitutional: She appears well-developed and well-nourished. She is cooperative. She is easily aroused. No distress.  HENT:  Head: Normocephalic and atraumatic.  Nose: Nose normal.  Mouth/Throat: Oropharynx is clear and moist.  Normal oropharynx and tonsils   Eyes: Pupils are equal, round, and reactive to light. Conjunctivae and EOM are normal.  Neck: Normal range of motion.  Cardiovascular: Normal rate, regular rhythm, S1 normal, S2 normal, normal heart sounds  and intact distal pulses.  Exam reveals no distant heart sounds and no friction rub.   No murmur heard. Pulses:      Carotid pulses are 2+ on the right side, and 2+ on the left side.      Radial pulses are 2+ on the right side, and 2+ on the left side.       Dorsalis pedis pulses are 2+ on the right side, and 2+ on the left side.  Pulmonary/Chest: Effort normal and breath sounds normal. No respiratory distress. She has no decreased breath sounds. She has no wheezes. She has no rales. She exhibits no tenderness.  Abdominal: Soft. Bowel sounds are normal. She exhibits no distension  and no mass. There is no tenderness. There is no rebound and no guarding.  Abdomen is soft NTND  Genitourinary: Vaginal discharge found.  Genitourinary Comments: External genitalia normal without erythema, edema, tenderness or lesions.  No groin lymphadenopathy.  Vaginal mucosa normal, pink without lesions.  +Curd like white discharge in upper vaginal vault and on cervix Cervix is not friable, no strawberry cervix Uterus in midline, smooth, not enlarged or tender. No CMT. Non palpable adnexa.  Neurological: She is easily aroused.  Skin: Skin is warm and dry. Capillary refill takes less than 2 seconds.  Psychiatric: She has a normal mood and affect. Her behavior is normal. Judgment and thought content normal.  Nursing note and vitals reviewed.    ED Treatments / Results  Labs (all labs ordered are listed, but only abnormal results are displayed) Labs Reviewed  WET PREP, GENITAL - Abnormal; Notable for the following:       Result Value   Clue Cells Wet Prep HPF POC PRESENT (*)    WBC, Wet Prep HPF POC FEW (*)    All other components within normal limits  URINALYSIS, ROUTINE W REFLEX MICROSCOPIC - Abnormal; Notable for the following:    Specific Gravity, Urine >1.030 (*)    All other components within normal limits  PREGNANCY, URINE  GC/CHLAMYDIA PROBE AMP ([place]) NOT AT [facility]    EKG  EKG Interpretation None       Radiology No results found.  Procedures Procedures (including critical care time)  Medications Ordered in ED Medications  azithromycin (ZITHROMAX) tablet 1,000 mg (1,000 mg Oral Given 11/18/16 1659)  cefTRIAXone (ROCEPHIN) injection 250 mg (250 mg Intramuscular Given 11/18/16 1748)     Initial Impression / Assessment and Plan / ED Course  I have reviewed the triage vital signs and the nursing notes.  Pertinent labs & imaging results that were available during my care of the patient were reviewed by me and considered in my medical decision making (see  chart for details).    46 year old female with history of recurrent BV and PID presents to ED for evaluation of white vaginal discharge, itching and dysuria since yesterday. No fevers, chills, abdominal pain, back pain, vaginal bleeding. High-risk sexual practices. Wet prep shows BV but no trichomoniasis or yeast. GC/chlamydia pending. Urinalysis via evidence of infection. Patient was empirically treated for GC/chlamydia in the ED. Symptoms in ED workup not consistent with Tylenol, torsion or POA. We'll DC with Flagyl and contact information for women's clinic to address her recurrent BV. Patient verbalized understanding and is agreeable with ED workup and discharge plan.  Final Clinical Impressions(s) / ED Diagnoses   Final diagnoses:  Vaginal discharge  BV (bacterial vaginosis)    New Prescriptions Discharge Medication List as of 11/18/2016  5:39 PM  START taking these medications   Details  metroNIDAZOLE (FLAGYL) 500 MG tablet Take 1 tablet (500 mg total) by mouth 2 (two) times daily., Starting Wed 11/18/2016, Print         Liberty Handy, PA-C 11/18/16 1800    Melene Plan, DO 11/18/16 2338

## 2016-11-18 NOTE — Discharge Instructions (Signed)
You were evaluated in the ED for vaginal discharge and itching. You have bacterial vaginosis. You do not have a yeast infection. You do not have trichomonas. Your gonorrhea and chlamydia test results are still pending, you will be contacted via phone call if these are positive. You were treated for gonorrhea and chlamydia today.  Contact Women's clinic for further discussion of recurrent bacterial vaginosis.   Take metronidazole as prescribed. Do not consume alcohol with this medication as you will get severe abdominal pain, nausea and vomiting. Do not consume alcohol for 3-5 days after completion of medication. Taking this medication with food may help GI upset.

## 2016-11-19 LAB — GC/CHLAMYDIA PROBE AMP (~~LOC~~) NOT AT ARMC
Chlamydia: NEGATIVE
Neisseria Gonorrhea: NEGATIVE

## 2016-11-23 ENCOUNTER — Emergency Department (HOSPITAL_BASED_OUTPATIENT_CLINIC_OR_DEPARTMENT_OTHER)
Admission: EM | Admit: 2016-11-23 | Discharge: 2016-11-23 | Disposition: A | Discharge status: Home / Self Care | Payer: Self-pay | Attending: Emergency Medicine | Admitting: Emergency Medicine

## 2016-11-23 ENCOUNTER — Encounter (HOSPITAL_BASED_OUTPATIENT_CLINIC_OR_DEPARTMENT_OTHER): Payer: Self-pay | Admitting: Emergency Medicine

## 2016-11-23 DIAGNOSIS — F17210 Nicotine dependence, cigarettes, uncomplicated: Secondary | ICD-10-CM | POA: Insufficient documentation

## 2016-11-23 DIAGNOSIS — Z760 Encounter for issue of repeat prescription: Secondary | ICD-10-CM | POA: Insufficient documentation

## 2016-11-23 DIAGNOSIS — Z91010 Allergy to peanuts: Secondary | ICD-10-CM | POA: Insufficient documentation

## 2016-11-23 DIAGNOSIS — F419 Anxiety disorder, unspecified: Secondary | ICD-10-CM | POA: Insufficient documentation

## 2016-11-23 DIAGNOSIS — J45909 Unspecified asthma, uncomplicated: Secondary | ICD-10-CM | POA: Insufficient documentation

## 2016-11-23 MED ORDER — BUSPIRONE HCL 15 MG PO TABS: 15.0000 mg | ORAL_TABLET | Freq: Three times a day (TID) | ORAL | 0 refills | Status: AC

## 2016-11-23 NOTE — ED Notes (Signed)
Pt reports running out of medication on Friday. Sts her Rx are supposed to be mailed to her, but sts that something happened and the prescription was messed up. Reports feeling very anxious and needs medication prior to going to work tomorrow.

## 2016-11-23 NOTE — Discharge Instructions (Signed)
You were seen in the ED today with worsening anxiety in the setting of running out of your medication. I have provided a short re-fill of medication to last until your new Rx arrives in the mail.   Return to the ED with any chest pain, difficulty breathing, or anxiety to the point of feeling unsafe.

## 2016-11-23 NOTE — ED Provider Notes (Signed)
Emergency Department Provider Note   I have reviewed the triage vital signs and the nursing notes.   HISTORY  Chief Complaint Medication Refill   HPI Cindy Bailey is a 46 y.o. female with anxiety and asthma presents to the emergency department for evaluation of worsening anxiety in the setting of running out of her anxiety medication. Intakes Buspar 15 mg TID for the past several years. She typically has her medication refilled through a female in pharmacy. She called last week to say that she was getting low but there was some delay in getting her the refill prescription. Patient has been cutting back her medication to try and last until midweek when she will receive her refill in the mail. She has had worsening anxiety, crying episodes, and 1-2 anxiety attacks over the weekend. He denies any suicidal or homicidal ideation. She is requesting a medication refill in the emergency Department the last until hers arrives in the mail.    Past Medical History:  Diagnosis Date  . Anxiety   . Asthma   . Bacterial vaginosis   . PID (acute pelvic inflammatory disease)     There are no active problems to display for this patient.   Past Surgical History:  Procedure Laterality Date  . WISDOM TOOTH EXTRACTION      Current Outpatient Rx  . Order #: [id number] Class: Print  . Order #: [id number] Class: Historical Med  . Order #: [id number] Class: Print  . Order #: [id number] Class: Historical Med  . Order #: [id number] Class: Historical Med    Allergies Peanut-containing drug products and Penicillins  History reviewed. No pertinent family history.  Social History Social History  Substance Use Topics  . Smoking status: Current Every Day Smoker    Packs/day: 0.50    Types: Cigarettes  . Smokeless tobacco: Never Used  . Alcohol use Yes     Comment: occ    Review of Systems  Constitutional: No fever/chills Eyes: No visual changes. ENT: No sore throat. Cardiovascular: Denies chest  pain. Respiratory: Denies shortness of breath. Gastrointestinal: No abdominal pain.  No nausea, no vomiting.  No diarrhea.  No constipation. Genitourinary: Negative for dysuria. Musculoskeletal: Negative for back pain. Skin: Negative for rash. Neurological: Negative for headaches, focal weakness or numbness.  10-point ROS otherwise negative.  ____________________________________________   PHYSICAL EXAM:  VITAL SIGNS: ED Triage Vitals [11/23/16 1231]  Enc Vitals Group     BP (!) 152/79     Pulse Rate 65     Resp 18     Temp 98.3 F (36.8 C)     Temp Source Oral     SpO2 98 %     Weight 156 lb (70.8 kg)     Height 5\' 7"  (1.702 m)    Constitutional: Alert and oriented. Well appearing and in no acute distress. Eyes: Conjunctivae are normal.  Head: Atraumatic. Nose: No congestion/rhinnorhea. Mouth/Throat: Mucous membranes are moist.  Neck: No stridor.  Cardiovascular: Normal rate, regular rhythm. Good peripheral circulation. Grossly normal heart sounds.   Respiratory: Normal respiratory effort.  No retractions. Lungs CTAB. Gastrointestinal: Soft and nontender. No distention.  Musculoskeletal: No lower extremity tenderness nor edema. No gross deformities of extremities. Neurologic:  Normal speech and language. No gross focal neurologic deficits are appreciated.  Skin:  Skin is warm, dry and intact. No rash noted.  ____________________________________________   PROCEDURES  Procedure(s) performed:   Procedures  None ____________________________________________   INITIAL IMPRESSION / ASSESSMENT AND PLAN / ED COURSE  Pertinent labs &  imaging results that were available during my care of the patient were reviewed by me and considered in my medical decision making (see chart for details).  Patient resents emergency department requesting refill of Buspar 15 mg TID. She has new refill to arrive in 3 days by mail order pharmacy. Patient has been having worsening anxiety  symptoms. I have provided refill for the next 5 days.   At this time, I do not feel there is any life-threatening condition present. I have reviewed and discussed all results (EKG, imaging, lab, urine as appropriate), exam findings with patient. I have reviewed nursing notes and appropriate previous records.  I feel the patient is safe to be discharged home without further emergent workup. Discussed usual and customary return precautions. Patient and family (if present) verbalize understanding and are comfortable with this plan.  Patient will follow-up with their primary care provider. If they do not have a primary care provider, information for follow-up has been provided to them. All questions have been answered.  ____________________________________________  FINAL CLINICAL IMPRESSION(S) / ED DIAGNOSES  Final diagnoses:  Encounter for medication refill  Anxiety     MEDICATIONS GIVEN DURING THIS VISIT:  Medications - No data to display   NEW OUTPATIENT MEDICATIONS STARTED DURING THIS VISIT:  New Prescriptions   BUSPIRONE (BUSPAR) 15 MG TABLET    Take 1 tablet (15 mg total) by mouth 3 (three) times daily.    Note:  This document was prepared using Dragon voice recognition software and may include unintentional dictation errors.  Joshua Long, MD [facility]    Long, Arlyss RepressJoshua G, MD 11/23/16 [phone]

## 2016-11-23 NOTE — ED Triage Notes (Signed)
Patient reports out of buspar.  States will not be here until Wednesday.  Needs RX to get her through until then.  Noted to be tearful at triage in NAD.

## 2016-12-12 ENCOUNTER — Encounter (HOSPITAL_BASED_OUTPATIENT_CLINIC_OR_DEPARTMENT_OTHER): Payer: Self-pay | Admitting: Emergency Medicine

## 2016-12-12 ENCOUNTER — Emergency Department (HOSPITAL_BASED_OUTPATIENT_CLINIC_OR_DEPARTMENT_OTHER)
Admission: EM | Admit: 2016-12-12 | Discharge: 2016-12-12 | Disposition: A | Discharge status: Home / Self Care | Payer: Self-pay | Attending: Emergency Medicine | Admitting: Emergency Medicine

## 2016-12-12 ENCOUNTER — Emergency Department (HOSPITAL_BASED_OUTPATIENT_CLINIC_OR_DEPARTMENT_OTHER): Payer: Self-pay

## 2016-12-12 DIAGNOSIS — Z91010 Allergy to peanuts: Secondary | ICD-10-CM | POA: Insufficient documentation

## 2016-12-12 DIAGNOSIS — F17210 Nicotine dependence, cigarettes, uncomplicated: Secondary | ICD-10-CM | POA: Insufficient documentation

## 2016-12-12 DIAGNOSIS — N939 Abnormal uterine and vaginal bleeding, unspecified: Secondary | ICD-10-CM | POA: Insufficient documentation

## 2016-12-12 DIAGNOSIS — R102 Pelvic and perineal pain: Secondary | ICD-10-CM | POA: Insufficient documentation

## 2016-12-12 DIAGNOSIS — Z79899 Other long term (current) drug therapy: Secondary | ICD-10-CM | POA: Insufficient documentation

## 2016-12-12 LAB — CBC WITH DIFFERENTIAL/PLATELET
BASOS PCT: 0 %
Basophils Absolute: 0 10*3/uL (ref 0.0–0.1)
Eosinophils Absolute: 0.1 10*3/uL (ref 0.0–0.7)
Eosinophils Relative: 2 %
HEMATOCRIT: 41.7 % (ref 36.0–46.0)
Hemoglobin: 14.3 g/dL (ref 12.0–15.0)
LYMPHS ABS: 2.7 10*3/uL (ref 0.7–4.0)
LYMPHS PCT: 31 %
MCH: 31.3 pg (ref 26.0–34.0)
MCHC: 34.3 g/dL (ref 30.0–36.0)
MCV: 91.2 fL (ref 78.0–100.0)
MONO ABS: 0.8 10*3/uL (ref 0.1–1.0)
MONOS PCT: 9 %
NEUTROS ABS: 5.2 10*3/uL (ref 1.7–7.7)
Neutrophils Relative %: 58 %
Platelets: 395 10*3/uL (ref 150–400)
RBC: 4.57 MIL/uL (ref 3.87–5.11)
RDW: 12.5 % (ref 11.5–15.5)
WBC: 8.8 10*3/uL (ref 4.0–10.5)

## 2016-12-12 LAB — URINALYSIS, MICROSCOPIC (REFLEX)

## 2016-12-12 LAB — URINALYSIS, ROUTINE W REFLEX MICROSCOPIC
Bilirubin Urine: NEGATIVE
Glucose, UA: NEGATIVE mg/dL
Ketones, ur: NEGATIVE mg/dL
Leukocytes, UA: NEGATIVE
Nitrite: NEGATIVE
PROTEIN: NEGATIVE mg/dL
Specific Gravity, Urine: 1.015 (ref 1.005–1.030)
pH: 5.5 (ref 5.0–8.0)

## 2016-12-12 LAB — COMPREHENSIVE METABOLIC PANEL
ALT: 17 U/L (ref 14–54)
ANION GAP: 7 (ref 5–15)
AST: 21 U/L (ref 15–41)
Albumin: 4.2 g/dL (ref 3.5–5.0)
Alkaline Phosphatase: 49 U/L (ref 38–126)
BILIRUBIN TOTAL: 0.5 mg/dL (ref 0.3–1.2)
BUN: 13 mg/dL (ref 6–20)
CALCIUM: 9 mg/dL (ref 8.9–10.3)
CO2: 26 mmol/L (ref 22–32)
Chloride: 106 mmol/L (ref 101–111)
Creatinine, Ser: 0.64 mg/dL (ref 0.44–1.00)
GFR calc Af Amer: >60 mL/min (ref >60)
Glucose, Bld: 81 mg/dL (ref 65–99)
POTASSIUM: 3.9 mmol/L (ref 3.5–5.1)
Sodium: 139 mmol/L (ref 135–145)
TOTAL PROTEIN: 7.6 g/dL (ref 6.5–8.1)

## 2016-12-12 LAB — WET PREP, GENITAL
CLUE CELLS WET PREP: NONE SEEN
Sperm: NONE SEEN
Trich, Wet Prep: NONE SEEN
YEAST WET PREP: NONE SEEN

## 2016-12-12 LAB — PREGNANCY, URINE: PREG TEST UR: NEGATIVE

## 2016-12-12 NOTE — ED Triage Notes (Signed)
Pt reports heavy vaginal bleeding that is not her period x 3 days with LLQ abd pain.

## 2016-12-12 NOTE — ED Notes (Signed)
Nurse First Rounds: Updated on Wait time. Pt had no complaints.

## 2016-12-12 NOTE — ED Notes (Signed)
Patient transported to Ultrasound 

## 2016-12-12 NOTE — ED Provider Notes (Signed)
[facility]   CSN: [id number] Arrival date & time: 12/12/16  1420     History   Chief Complaint Chief Complaint  Patient presents with  . Vaginal Bleeding    HPI Cindy Bailey is a 46 y.o. female.  The history is provided by the patient and medical records.  Vaginal Bleeding  Primary symptoms include vaginal bleeding. Associated symptoms include abdominal pain.    46 y.o. F with hx of anxiety, asthma, BV, PID, presenting to the ED for LLQ abdominal pain and abnormal vaginal bleeding.  Patient reports she had her usual menstrual cycle the first week of this month but started bleeding again 5 days ago.  States initially it was very light and more like spotting but has become increasingly more heavy.  States her cycles are usually regular about every 28-30 days.  States she does have pain in the LLQ.  No associated N/V, does report a little diarrhea.  No fever, chills, sweats. No urinary symptoms.  No noted vaginal discharge.  No concern for STD at this time. Reports hx of ovarian cyst but states this does feel a little different.  She is concerned that she may be going through menopause as she has had some spotting over the past few months but usually it only lasts a day or so.  She was told previously to follow-up with OB-GYN but has not done so.  Past Medical History:  Diagnosis Date  . Anxiety   . Asthma   . Bacterial vaginosis   . PID (acute pelvic inflammatory disease)     There are no active problems to display for this patient.   Past Surgical History:  Procedure Laterality Date  . WISDOM TOOTH EXTRACTION      OB History    No data available       Home Medications    Prior to Admission medications   Medication Sig Start Date End Date Taking? Authorizing Provider  ibuprofen (ADVIL,MOTRIN) 200 MG tablet Take 200 mg by mouth every 6 (six) hours as needed.    [provider]  Multiple Vitamins-Minerals (MULTIVITAMIN ADULT PO)  Take by mouth.    [provider]  Probiotic Product (SOLUBLE FIBER/PROBIOTICS PO) Take by mouth.    [provider]    Family History No family history on file.  Social History Social History  Substance Use Topics  . Smoking status: Current Every Day Smoker    Packs/day: 0.50    Types: Cigarettes  . Smokeless tobacco: Never Used  . Alcohol use Yes     Comment: occ     Allergies   Peanut-containing drug products and Penicillins   Review of Systems Review of Systems  Gastrointestinal: Positive for abdominal pain.  Genitourinary: Positive for vaginal bleeding.  All other systems reviewed and are negative.    Physical Exam Updated Vital Signs BP 139/87 (BP Location: Left Arm)   Pulse 70   Temp 98.1 F (36.7 C) (Oral)   Resp 18   LMP 11/27/2016   SpO2 100%   Physical Exam  Constitutional: She is oriented to person, place, and time. She appears well-developed and well-nourished.  HENT:  Head: Normocephalic and atraumatic.  Mouth/Throat: Oropharynx is clear and moist.  Eyes: Pupils are equal, round, and reactive to light. Conjunctivae and EOM are normal.  Neck: Normal range of motion.  Cardiovascular: Normal rate, regular rhythm and normal heart sounds.   Pulmonary/Chest: Effort normal and breath sounds normal.  Abdominal: Soft.  Bowel sounds are normal. There is no tenderness. There is no rebound.  Reports some pain in LLQ but no focal tenderness, abdomen soft, normal bowel sounds  Genitourinary:  Genitourinary Comments: Exam chaperoned by NT Normal female external genitlaia without visible lesions or rash, vaginal bleeding noted without clots, cervical os closed, no friability, no appreciable discharge; no CMT; non-tender right adnexa but left is tender on bimanual exam  Musculoskeletal: Normal range of motion.  Neurological: She is alert and oriented to person, place, and time.  Skin: Skin is warm and dry.  Psychiatric: She has a normal mood and  affect.  Nursing note and vitals reviewed.    ED Treatments / Results  Labs (all labs ordered are listed, but only abnormal results are displayed) Labs Reviewed  URINALYSIS, ROUTINE W REFLEX MICROSCOPIC - Abnormal; Notable for the following:       Result Value   APPearance HAZY (*)    Hgb urine dipstick LARGE (*)    All other components within normal limits  URINALYSIS, MICROSCOPIC (REFLEX) - Abnormal; Notable for the following:    Bacteria, UA MANY (*)    Squamous Epithelial / LPF 0-5 (*)    All other components within normal limits  WET PREP, GENITAL  PREGNANCY, URINE  CBC WITH DIFFERENTIAL/PLATELET  COMPREHENSIVE METABOLIC PANEL  GC/CHLAMYDIA PROBE AMP ([place]) NOT AT [facility]    EKG  EKG Interpretation None       Radiology No results found.  Procedures Procedures (including critical care time)  Medications Ordered in ED Medications - No data to display   Initial Impression / Assessment and Plan / ED Course  I have reviewed the triage vital signs and the nursing notes.  Pertinent labs & imaging results that were available during my care of the patient were reviewed by me and considered in my medical decision making (see chart for details).  46 y.o. F here with vaginal bleeding.  States it is not time for her menstrual cycle, unsure why she is bleeding. She is afebrile, nontoxic. Abdomen is benign. Vitals stable. Screening lab work obtained, overall reassuring. H&H is stable. Pelvic exam performed, some vaginal bleeding noted without appreciable discharge. She has left adnexal tenderness, otherwise benign. Wet prep without acute findings. UA with large blood and many bacteria, however suspect this is contamination from her vaginal bleeding. She has no current urinary symptoms.  Given her adnexal tenderness, ultrasound was obtained without acute findings. Patient does express concern for early menopause, this is a possibility given her age. I recommend that she  follow-up closely with an OB/GYN for ongoing evaluation/management.  Discussed plan with patient, she acknowledged understanding and agreed with plan of care.  Return precautions given for new or worsening symptoms.  Final Clinical Impressions(s) / ED Diagnoses   Final diagnoses:  Pelvic pain  Vaginal bleeding    New Prescriptions Discharge Medication List as of 12/12/2016  6:39 PM       Garlon Hatchet, PA-C 12/12/16 2000    Derwood Kaplan, MD 12/13/16 [phone]

## 2016-12-12 NOTE — Discharge Instructions (Signed)
As we discussed, your labs and ultrasound today were normal. Would recommend Tylenol or Motrin for pain. Follow-up with GYN for further evaluation of abnormal vaginal bleeding. Call Monday to make an appointment. Return to the ED for new or worsening symptoms.

## 2016-12-14 LAB — GC/CHLAMYDIA PROBE AMP (~~LOC~~) NOT AT ARMC
Chlamydia: NEGATIVE
NEISSERIA GONORRHEA: NEGATIVE

## 2017-01-11 ENCOUNTER — Encounter: Payer: Self-pay | Admitting: Obstetrics & Gynecology

## 2017-02-02 ENCOUNTER — Encounter: Payer: Self-pay | Admitting: Obstetrics & Gynecology

## 2017-02-02 ENCOUNTER — Ambulatory Visit: Payer: Self-pay | Admitting: Obstetrics & Gynecology

## 2017-02-02 VITALS — BP 122/73 | HR 69 | Ht 67.0 in | Wt 156.2 lb

## 2017-02-02 DIAGNOSIS — N926 Irregular menstruation, unspecified: Secondary | ICD-10-CM

## 2017-02-02 LAB — POCT URINALYSIS DIP (DEVICE)
BILIRUBIN URINE: NEGATIVE
Glucose, UA: NEGATIVE mg/dL
Ketones, ur: NEGATIVE mg/dL
Leukocytes, UA: NEGATIVE
NITRITE: NEGATIVE
PH: 5.5 (ref 5.0–8.0)
PROTEIN: NEGATIVE mg/dL
Specific Gravity, Urine: >=1.03 (ref 1.005–1.030)
UROBILINOGEN UA: 0.2 mg/dL (ref 0.0–1.0)

## 2017-02-02 NOTE — Progress Notes (Signed)
Patient ID: Cindy CraftJennifer A Bailey, female   DOB: October 13, 1970, 46 y.o.   MRN: [id number]  No chief complaint on file.     Cindy CraftJennifer A Bailey is a 46 y.o. female. HPI 46 yo Single P0 here today as a new patient irregular bleeding. She was seen in the ER and an u/s was normal. Her hbg was 14. She skipped her period last month, also having mood swings (her girlfriend died last last week). She denies vaginal discharge, fever, pain.  She is also concerned about recurrent BV. Past Medical History:  Diagnosis Date  . Anxiety   . Asthma   . Bacterial vaginosis   . PID (acute pelvic inflammatory disease)     Past Surgical History:  Procedure Laterality Date  . WISDOM TOOTH EXTRACTION      No family history on file.  Social History Social History   Tobacco Use  . Smoking status: Current Every Day Smoker    Packs/day: 0.50    Types: Cigarettes  . Smokeless tobacco: Never Used  Substance Use Topics  . Alcohol use: Yes    Comment: occ  . Drug use: Yes    Types: Marijuana    Bisexual  Allergies  Allergen Reactions  . Peanut-Containing Drug Products   . Penicillins     Current Outpatient Medications  Medication Sig Dispense Refill  . ibuprofen (ADVIL,MOTRIN) 200 MG tablet Take 200 mg by mouth every 6 (six) hours as needed.    . Multiple Vitamins-Minerals (MULTIVITAMIN ADULT PO) Take by mouth.    . Probiotic Product (SOLUBLE FIBER/PROBIOTICS PO) Take by mouth.     No current facility-administered medications for this visit.     Review of Systems Review of Systems  There were no vitals taken for this visit.  Physical Exam Physical Exam Breathing, conversing, and ambulating normally Well nourished, well hydrated white female, no apparent distress Heart- rrr Lungs- CTAB Abd- benign  Data Reviewed  IMPRESSION: 1. The left ovary is normal in appearance. No abnormalities seen in the uterus or endometrium to explain the patient's symptoms of pain. A small amount of fluid in  the cervical canal is consistent with the reported history of bleeding. The endometrium is normal in thickness and mildly heterogeneous with no focal mass.  Assessment    Preventative care    Plan   [facility]    Mammogram scholarship info given   Cindy Bailey 02/02/2017, 9:27 AM

## 2017-02-03 LAB — FOLLICLE STIMULATING HORMONE: FSH: 29.2 m[IU]/mL

## 2017-02-03 LAB — TSH: TSH: 0.636 u[IU]/mL (ref 0.450–4.500)

## 2017-02-09 ENCOUNTER — Other Ambulatory Visit (HOSPITAL_COMMUNITY): Payer: Self-pay | Admitting: Obstetrics & Gynecology

## 2017-03-09 ENCOUNTER — Emergency Department (HOSPITAL_BASED_OUTPATIENT_CLINIC_OR_DEPARTMENT_OTHER)
Admission: EM | Admit: 2017-03-09 | Discharge: 2017-03-09 | Disposition: A | Discharge status: Home / Self Care | Payer: Self-pay | Attending: Emergency Medicine | Admitting: Emergency Medicine

## 2017-03-09 ENCOUNTER — Encounter (HOSPITAL_BASED_OUTPATIENT_CLINIC_OR_DEPARTMENT_OTHER): Payer: Self-pay | Admitting: Emergency Medicine

## 2017-03-09 ENCOUNTER — Other Ambulatory Visit: Payer: Self-pay

## 2017-03-09 DIAGNOSIS — B9689 Other specified bacterial agents as the cause of diseases classified elsewhere: Secondary | ICD-10-CM

## 2017-03-09 DIAGNOSIS — N390 Urinary tract infection, site not specified: Secondary | ICD-10-CM | POA: Insufficient documentation

## 2017-03-09 DIAGNOSIS — Z91010 Allergy to peanuts: Secondary | ICD-10-CM | POA: Insufficient documentation

## 2017-03-09 DIAGNOSIS — N760 Acute vaginitis: Secondary | ICD-10-CM | POA: Insufficient documentation

## 2017-03-09 DIAGNOSIS — F17210 Nicotine dependence, cigarettes, uncomplicated: Secondary | ICD-10-CM | POA: Insufficient documentation

## 2017-03-09 DIAGNOSIS — J45909 Unspecified asthma, uncomplicated: Secondary | ICD-10-CM | POA: Insufficient documentation

## 2017-03-09 DIAGNOSIS — Z79899 Other long term (current) drug therapy: Secondary | ICD-10-CM | POA: Insufficient documentation

## 2017-03-09 LAB — WET PREP, GENITAL
Sperm: NONE SEEN
Trich, Wet Prep: NONE SEEN
Yeast Wet Prep HPF POC: NONE SEEN

## 2017-03-09 LAB — URINALYSIS, MICROSCOPIC (REFLEX)

## 2017-03-09 LAB — PREGNANCY, URINE: Preg Test, Ur: NEGATIVE

## 2017-03-09 LAB — URINALYSIS, ROUTINE W REFLEX MICROSCOPIC
BILIRUBIN URINE: NEGATIVE
GLUCOSE, UA: NEGATIVE mg/dL
Ketones, ur: NEGATIVE mg/dL
Nitrite: NEGATIVE
PH: 6 (ref 5.0–8.0)
Protein, ur: NEGATIVE mg/dL

## 2017-03-09 MED ORDER — METRONIDAZOLE 500 MG PO TABS: 500.0000 mg | ORAL_TABLET | Freq: Two times a day (BID) | ORAL | 0 refills | Status: DC

## 2017-03-09 MED ORDER — SULFAMETHOXAZOLE-TRIMETHOPRIM 800-160 MG PO TABS: 1.0000 | ORAL_TABLET | Freq: Two times a day (BID) | ORAL | 0 refills | Status: AC

## 2017-03-09 MED FILL — SULFAMETHOXAZOLE-TMP DS TAB: 800-160 | 7 days supply | Qty: 14 | Fill #0

## 2017-03-09 MED FILL — metroNIDAZOLE 500 MG TABS: 500 | 7 days supply | Qty: 14 | Fill #0

## 2017-03-09 NOTE — Discharge Instructions (Signed)
Take Bactrim and Flagyl as prescribed until completed.  Do not drink alcohol within 24 hours of taking Flagyl.  A urine culture will be sent and you will be called if a change she is to be made in your antibiotic for your urinary tract infection.  Please return to the emergency department if you develop any new or worsening symptoms including fever over 100.4, severe pain, intractable vomiting, or any other new or concerning symptoms.

## 2017-03-09 NOTE — ED Triage Notes (Addendum)
Dysuria, back pain, vaginal discharge, and hematuria since yesterday.

## 2017-03-09 NOTE — ED Provider Notes (Signed)
[facility] EMERGENCY DEPARTMENT Provider Note   CSN: [id number] Arrival date & time: 03/09/17  1011     History   Chief Complaint Chief Complaint  Patient presents with  . Dysuria  . Vaginal Discharge    HPI Cindy Bailey is a 46 y.o. female with history of bacterial vaginosis, PID who presents with a 1 day history of dysuria, intermittent low back pain, hematuria, and foul vaginal odor.  She denies any new vaginal discharge.  No abnormal vaginal bleeding.  LMP 02/11/2017.  Feels she has a urinary tract infection and she may have a recurrence of her bacterial vaginosis.  She is not taking any medications at home for symptoms.  She has no concern for STD exposure.  She denies any fevers, chest pain, shortness of breath, abdominal pain, nausea, vomiting, diarrhea.  HPI  Past Medical History:  Diagnosis Date  . Anxiety   . Asthma   . Bacterial vaginosis   . PID (acute pelvic inflammatory disease)     There are no active problems to display for this patient.   Past Surgical History:  Procedure Laterality Date  . WISDOM TOOTH EXTRACTION      OB History    Gravida Para Term Preterm AB Living   0 0 0 0 0 0   SAB TAB Ectopic Multiple Live Births   0 0 0 0 0       Home Medications    Prior to Admission medications   Medication Sig Start Date End Date Taking? Authorizing Provider  busPIRone (BUSPAR) 15 MG tablet Take 15 mg 3 (three) times daily by mouth.   Yes [provider]  ibuprofen (ADVIL,MOTRIN) 200 MG tablet Take 200 mg by mouth every 6 (six) hours as needed.    [provider]  metroNIDAZOLE (FLAGYL) 500 MG tablet Take 1 tablet (500 mg total) by mouth 2 (two) times daily. 03/09/17   Ariya Bohannon, Waylan BogaAlexandra M, PA-C  Multiple Vitamins-Minerals (MULTIVITAMIN ADULT PO) Take by mouth.    [provider]  Probiotic Product (SOLUBLE FIBER/PROBIOTICS PO) Take by mouth.    [provider]  sulfamethoxazole-trimethoprim (BACTRIM  DS,SEPTRA DS) 800-160 MG tablet Take 1 tablet by mouth 2 (two) times daily for 7 days. 03/09/17 03/16/17  Emi HolesLaw, Skippy Marhefka M, PA-C    Family History No family history on file.  Social History Social History   Tobacco Use  . Smoking status: Current Every Day Smoker    Packs/day: 0.50    Types: Cigarettes, E-cigarettes  . Smokeless tobacco: Never Used  . Tobacco comment: Mostly vaping  Substance Use Topics  . Alcohol use: Yes    Comment: occ  . Drug use: Yes    Types: Marijuana     Allergies   Peanut-containing drug products and Penicillins   Review of Systems Review of Systems  Constitutional: Negative for chills and fever.  HENT: Negative for facial swelling and sore throat.   Respiratory: Negative for shortness of breath.   Cardiovascular: Negative for chest pain.  Gastrointestinal: Negative for abdominal pain, nausea and vomiting.  Genitourinary: Positive for dysuria, hematuria and vaginal discharge (odor only). Negative for pelvic pain and vaginal bleeding.  Musculoskeletal: Positive for back pain (low).  Skin: Negative for rash and wound.  Neurological: Negative for headaches.  Psychiatric/Behavioral: The patient is not nervous/anxious.      Physical Exam Updated Vital Signs BP (!) 145/78 (BP Location: Right Arm)   Pulse 73   Temp 98 F (36.7 C) (Oral)  Resp 16   Ht 5\' 7"  (1.702 m)   Wt 70.3 kg (155 lb)   LMP 02/11/2017   SpO2 100%   BMI 24.28 kg/m   Physical Exam  Constitutional: She appears well-developed and well-nourished. No distress.  HENT:  Head: Normocephalic and atraumatic.  Mouth/Throat: Oropharynx is clear and moist. No oropharyngeal exudate.  Eyes: Conjunctivae are normal. Pupils are equal, round, and reactive to light. Right eye exhibits no discharge. Left eye exhibits no discharge. No scleral icterus.  Neck: Normal range of motion. Neck supple. No thyromegaly present.  Cardiovascular: Normal rate, regular rhythm, normal heart sounds and  intact distal pulses. Exam reveals no gallop and no friction rub.  No murmur heard. Pulmonary/Chest: Effort normal and breath sounds normal. No stridor. No respiratory distress. She has no wheezes. She has no rales.  Abdominal: Soft. Bowel sounds are normal. She exhibits no distension. There is no tenderness. There is no rebound and no guarding.    Genitourinary: Uterus normal. Cervix exhibits no motion tenderness. Right adnexum displays no mass, no tenderness and no fullness. Left adnexum displays no mass, no tenderness and no fullness. There is bleeding (small amount) in the vagina. Vaginal discharge found.  Musculoskeletal: She exhibits no edema.       Back:  Lymphadenopathy:    She has no cervical adenopathy.  Neurological: She is alert. Coordination normal.  Skin: Skin is warm and dry. No rash noted. She is not diaphoretic. No pallor.  Psychiatric: She has a normal mood and affect.  Nursing note and vitals reviewed.    ED Treatments / Results  Labs (all labs ordered are listed, but only abnormal results are displayed) Labs Reviewed  WET PREP, GENITAL - Abnormal; Notable for the following components:      Result Value   Clue Cells Wet Prep HPF POC PRESENT (*)    WBC, Wet Prep HPF POC MODERATE (*)    All other components within normal limits  URINALYSIS, ROUTINE W REFLEX MICROSCOPIC - Abnormal; Notable for the following components:   APPearance CLOUDY (*)    Specific Gravity, Urine >1.030 (*)    Hgb urine dipstick LARGE (*)    Leukocytes, UA SMALL (*)    All other components within normal limits  URINALYSIS, MICROSCOPIC (REFLEX) - Abnormal; Notable for the following components:   Bacteria, UA MANY (*)    Squamous Epithelial / LPF 6-30 (*)    All other components within normal limits  URINE CULTURE  PREGNANCY, URINE  GC/CHLAMYDIA PROBE AMP ([place]) NOT AT [facility]    EKG  EKG Interpretation None       Radiology No results found.  Procedures Procedures  (including critical care time)  Medications Ordered in ED Medications - No data to display   Initial Impression / Assessment and Plan / ED Course  I have reviewed the triage vital signs and the nursing notes.  Pertinent labs & imaging results that were available during my care of the patient were reviewed by me and considered in my medical decision making (see chart for details).     Patient with UTI and bacterial vaginosis.  UA shows large hematuria, small leukocytes, many bacteria, too numerous to count WBCs.  Wet prep shows clue cells.  GC/chlamydia sent and pending.  Urine culture pending.  No significant adnexal tenderness indicating pelvic ultrasound at this time.  Will initiate Bactrim and Flagyl.  Return precautions discussed.  Patient understands and agrees with plan.  Patient vitals stable and discharged in  satisfactory condition.  Final Clinical Impressions(s) / ED Diagnoses   Final diagnoses:  Lower urinary tract infectious disease  BV (bacterial vaginosis)    ED Discharge Orders        Ordered    sulfamethoxazole-trimethoprim (BACTRIM DS,SEPTRA DS) 800-160 MG tablet  2 times daily     03/09/17 1228    metroNIDAZOLE (FLAGYL) 500 MG tablet  2 times daily     03/09/17 [address]1228       Sylvester Minton M, [place] JerseyPA-C 03/09/17 1358    Maia PlanLong, Joshua G, MD 03/09/17 1941

## 2017-03-10 LAB — GC/CHLAMYDIA PROBE AMP (~~LOC~~) NOT AT ARMC
Chlamydia: NEGATIVE
Neisseria Gonorrhea: NEGATIVE

## 2017-03-11 ENCOUNTER — Other Ambulatory Visit (HOSPITAL_COMMUNITY): Payer: Self-pay | Admitting: Obstetrics & Gynecology

## 2017-03-11 DIAGNOSIS — Z1231 Encounter for screening mammogram for malignant neoplasm of breast: Secondary | ICD-10-CM

## 2017-03-11 LAB — URINE CULTURE: Culture: >=100000 — AB

## 2017-03-12 ENCOUNTER — Telehealth: Payer: Self-pay | Admitting: *Deleted

## 2017-03-12 NOTE — Telephone Encounter (Signed)
Post ED Visit - Positive Culture Follow-up  Culture report reviewed by antimicrobial stewardship pharmacist:  [x]  Enzo BiNathan Batchelder, Pharm.D. []  Celedonio MiyamotoJeremy Frens, Pharm.D., BCPS AQ-ID []  Garvin FilaMike Maccia, Pharm.D., BCPS []  Georgina PillionElizabeth Martin, Pharm.D., BCPS []  MadisonMinh Pham, [address] BoulevardPharm.D., BCPS, AAHIVP []  Estella HuskMichelle Turner, Pharm.D., BCPS, AAHIVP []  Lysle Pearlachel Rumbarger, PharmD, BCPS []  Casilda Carlsaylor Stone, PharmD, BCPS []  Pollyann SamplesAndy Johnston, PharmD, BCPS  Positive urine culture Treated with Sulfamethoxazole-Trimethoprim, organism sensitive to the same and no further patient follow-up is required at this time.  Virl AxeRobertson, Sparsh Callens Talley 03/12/2017, 10:14 AM

## 2017-04-02 ENCOUNTER — Emergency Department (HOSPITAL_BASED_OUTPATIENT_CLINIC_OR_DEPARTMENT_OTHER)
Admission: EM | Admit: 2017-04-02 | Discharge: 2017-04-02 | Disposition: A | Discharge status: Home / Self Care | Payer: Self-pay | Attending: Emergency Medicine | Admitting: Emergency Medicine

## 2017-04-02 ENCOUNTER — Encounter (HOSPITAL_BASED_OUTPATIENT_CLINIC_OR_DEPARTMENT_OTHER): Payer: Self-pay | Admitting: *Deleted

## 2017-04-02 ENCOUNTER — Other Ambulatory Visit: Payer: Self-pay

## 2017-04-02 DIAGNOSIS — F17210 Nicotine dependence, cigarettes, uncomplicated: Secondary | ICD-10-CM | POA: Insufficient documentation

## 2017-04-02 DIAGNOSIS — Z79899 Other long term (current) drug therapy: Secondary | ICD-10-CM | POA: Insufficient documentation

## 2017-04-02 DIAGNOSIS — J069 Acute upper respiratory infection, unspecified: Secondary | ICD-10-CM | POA: Insufficient documentation

## 2017-04-02 DIAGNOSIS — J45909 Unspecified asthma, uncomplicated: Secondary | ICD-10-CM | POA: Insufficient documentation

## 2017-04-02 DIAGNOSIS — Z91010 Allergy to peanuts: Secondary | ICD-10-CM | POA: Insufficient documentation

## 2017-04-02 MED ORDER — PREDNISONE 10 MG PO TABS: ORAL_TABLET | ORAL | 0 refills | Status: DC

## 2017-04-02 MED ORDER — BENZONATATE 100 MG PO CAPS: 100.0000 mg | ORAL_CAPSULE | Freq: Three times a day (TID) | ORAL | 0 refills | Status: DC

## 2017-04-02 MED FILL — BENZONATATE 100 MG CAPSULE: 100 | 7 days supply | Qty: 21 | Fill #0

## 2017-04-02 MED FILL — predniSONE 10 MG TABS: 10 | 6 days supply | Qty: 21 | Fill #0

## 2017-04-02 NOTE — Discharge Instructions (Signed)
See your Physician for recheck on next week

## 2017-04-02 NOTE — ED Provider Notes (Signed)
[facility] EMERGENCY DEPARTMENT Provider Note   CSN: [id number] Arrival date & time: 04/02/17  1228     History   Chief Complaint Chief Complaint  Patient presents with  . Facial Pain    HPI Cindy Bailey is a 47 y.o. female.  The history is provided by the patient. No language interpreter was used.  Cough  This is a new problem. The problem occurs constantly. The problem has been rapidly worsening. The cough is non-productive. There has been no fever. Associated symptoms include rhinorrhea. She has tried nothing for the symptoms. The treatment provided no relief. Her past medical history does not include bronchitis, pneumonia or asthma.  Pt reports she has had a cough for the past 2 days.  Pt denies fever.  Pt is concerned that she has a sinus infection   Past Medical History:  Diagnosis Date  . Anxiety   . Asthma   . Bacterial vaginosis   . PID (acute pelvic inflammatory disease)     There are no active problems to display for this patient.   Past Surgical History:  Procedure Laterality Date  . WISDOM TOOTH EXTRACTION      OB History    Gravida Para Term Preterm AB Living   0 0 0 0 0 0   SAB TAB Ectopic Multiple Live Births   0 0 0 0 0       Home Medications    Prior to Admission medications   Medication Sig Start Date End Date Taking? Authorizing Provider  busPIRone (BUSPAR) 15 MG tablet Take 15 mg 3 (three) times daily by mouth.   Yes [provider]  ibuprofen (ADVIL,MOTRIN) 200 MG tablet Take 200 mg by mouth every 6 (six) hours as needed.   Yes [provider]  Multiple Vitamins-Minerals (MULTIVITAMIN ADULT PO) Take by mouth.   Yes [provider]  Probiotic Product (SOLUBLE FIBER/PROBIOTICS PO) Take by mouth.   Yes [provider]  metroNIDAZOLE (FLAGYL) 500 MG tablet Take 1 tablet (500 mg total) by mouth 2 (two) times daily. 03/09/17   Emi Holes, PA-C    Family History No family history on  file.  Social History Social History   Tobacco Use  . Smoking status: Current Every Day Smoker    Packs/day: 0.50    Types: Cigarettes, E-cigarettes  . Smokeless tobacco: Never Used  . Tobacco comment: Mostly vaping  Substance Use Topics  . Alcohol use: Yes    Comment: occ  . Drug use: Yes    Types: Marijuana     Allergies   Peanut-containing drug products and Penicillins   Review of Systems Review of Systems  HENT: Positive for rhinorrhea, sinus pressure and sinus pain. Negative for facial swelling.   Respiratory: Positive for cough.   All other systems reviewed and are negative.    Physical Exam Updated Vital Signs BP (!) 157/88 (BP Location: Right Arm)   Pulse 67   Temp 98.5 F (36.9 C) (Oral)   Resp 16   Ht 5\' 7"  (1.702 m)   Wt 70.3 kg (155 lb)   LMP 04/02/2017   SpO2 100%   BMI 24.28 kg/m   Physical Exam  Constitutional: She appears well-developed and well-nourished.  HENT:  Head: Normocephalic and atraumatic.  Right Ear: External ear normal.  Left Ear: External ear normal.  Tender right maxillary sinus   Eyes: Conjunctivae and EOM are normal. Pupils are equal, round, and reactive to light.  Neck: Normal range  of motion.  Cardiovascular: Normal rate.  Pulmonary/Chest: Effort normal.  Musculoskeletal: Normal range of motion.  Neurological: She is alert.  Skin: Skin is warm.  Psychiatric: She has a normal mood and affect.  Nursing note and vitals reviewed.    ED Treatments / Results  Labs (all labs ordered are listed, but only abnormal results are displayed) Labs Reviewed - No data to display  EKG  EKG Interpretation None       Radiology No results found.  Procedures Procedures (including critical care time)  Medications Ordered in ED Medications - No data to display   Initial Impression / Assessment and Plan / ED Course  I have reviewed the triage vital signs and the nursing notes.  Pertinent labs & imaging results that were  available during my care of the patient were reviewed by me and considered in my medical decision making (see chart for details).     Pt counseled on viral uri and congestion.  I doubt sinus infection or very early.   Pt advised to see her Md for recheck on Monday.   Final Clinical Impressions(s) / ED Diagnoses   Final diagnoses:  Upper respiratory tract infection, unspecified type    ED Discharge Orders        Ordered    predniSONE (DELTASONE) 10 MG tablet     04/02/17 1502    benzonatate (TESSALON) 100 MG capsule  Every 8 hours     04/02/17 1502    An After Visit Summary was printed and given to the patient.    Osie CheeksSofia, Leslie K, PA-C 04/02/17 1508    Rolland PorterJames, Mark, MD 04/06/17 [phone]

## 2017-04-02 NOTE — ED Triage Notes (Signed)
2 days of facial pain. She thinks she has sinusitis.

## 2017-05-09 ENCOUNTER — Other Ambulatory Visit: Payer: Self-pay

## 2017-05-09 ENCOUNTER — Encounter (HOSPITAL_BASED_OUTPATIENT_CLINIC_OR_DEPARTMENT_OTHER): Payer: Self-pay | Admitting: Emergency Medicine

## 2017-05-09 ENCOUNTER — Emergency Department (HOSPITAL_BASED_OUTPATIENT_CLINIC_OR_DEPARTMENT_OTHER)
Admission: EM | Admit: 2017-05-09 | Discharge: 2017-05-09 | Disposition: A | Discharge status: Home / Self Care | Payer: Self-pay | Attending: Emergency Medicine | Admitting: Emergency Medicine

## 2017-05-09 DIAGNOSIS — N3000 Acute cystitis without hematuria: Secondary | ICD-10-CM | POA: Insufficient documentation

## 2017-05-09 DIAGNOSIS — Z79899 Other long term (current) drug therapy: Secondary | ICD-10-CM | POA: Insufficient documentation

## 2017-05-09 DIAGNOSIS — F17210 Nicotine dependence, cigarettes, uncomplicated: Secondary | ICD-10-CM | POA: Insufficient documentation

## 2017-05-09 DIAGNOSIS — F419 Anxiety disorder, unspecified: Secondary | ICD-10-CM | POA: Insufficient documentation

## 2017-05-09 DIAGNOSIS — J45909 Unspecified asthma, uncomplicated: Secondary | ICD-10-CM | POA: Insufficient documentation

## 2017-05-09 DIAGNOSIS — Z91010 Allergy to peanuts: Secondary | ICD-10-CM | POA: Insufficient documentation

## 2017-05-09 LAB — URINALYSIS, COMPLETE (UACMP) WITH MICROSCOPIC
BILIRUBIN URINE: NEGATIVE
Glucose, UA: NEGATIVE mg/dL
HGB URINE DIPSTICK: NEGATIVE
KETONES UR: NEGATIVE mg/dL
NITRITE: NEGATIVE
PROTEIN: NEGATIVE mg/dL
Specific Gravity, Urine: 1.025 (ref 1.005–1.030)
pH: 6.5 (ref 5.0–8.0)

## 2017-05-09 LAB — WET PREP, GENITAL
Clue Cells Wet Prep HPF POC: NONE SEEN
Sperm: NONE SEEN
TRICH WET PREP: NONE SEEN
YEAST WET PREP: NONE SEEN

## 2017-05-09 LAB — PREGNANCY, URINE: Preg Test, Ur: NEGATIVE

## 2017-05-09 MED ORDER — SULFAMETHOXAZOLE-TRIMETHOPRIM 800-160 MG PO TABS: 1.0000 | ORAL_TABLET | Freq: Two times a day (BID) | ORAL | 0 refills | Status: AC

## 2017-05-09 NOTE — ED Triage Notes (Signed)
Dysuria and vaginal discharge x 1 month.

## 2017-05-09 NOTE — ED Provider Notes (Signed)
[facility] EMERGENCY DEPARTMENT Provider Note   CSN: [id number] Arrival date & time: 05/09/17  [id number]     History   Chief Complaint Chief Complaint  Patient presents with  . Dysuria  . Vaginal Discharge    HPI Cindy Bailey is a 47 y.o. female.  47 year old female with past medical history below who presents with dysuria.  She states that recently she has been having some burning with urination that feels similar to previous episodes of urinary tract infection.  Although she stated to triage that she was having vaginal discharge, she tells me that she has not had much vaginal discharge, she has occasionally had some white discharge but none heavy or consistent. She denies any fevers, vomiting, abdominal pain. She has mild diarrhea.  She is sexually active with one partner.   The history is provided by the patient.  Dysuria    Vaginal Discharge   Associated symptoms include dysuria.    Past Medical History:  Diagnosis Date  . Anxiety   . Asthma   . Bacterial vaginosis   . PID (acute pelvic inflammatory disease)     There are no active problems to display for this patient.   Past Surgical History:  Procedure Laterality Date  . WISDOM TOOTH EXTRACTION      OB History    Gravida Para Term Preterm AB Living   0 0 0 0 0 0   SAB TAB Ectopic Multiple Live Births   0 0 0 0 0       Home Medications    Prior to Admission medications   Medication Sig Start Date End Date Taking? Authorizing Provider  busPIRone (BUSPAR) 15 MG tablet Take 15 mg 3 (three) times daily by mouth.    [provider]  ibuprofen (ADVIL,MOTRIN) 200 MG tablet Take 200 mg by mouth every 6 (six) hours as needed.    [provider]  Multiple Vitamins-Minerals (MULTIVITAMIN ADULT PO) Take by mouth.    [provider]  Probiotic Product (SOLUBLE FIBER/PROBIOTICS PO) Take by mouth.    [provider]  sulfamethoxazole-trimethoprim (BACTRIM DS,SEPTRA DS)  800-160 MG tablet Take 1 tablet by mouth 2 (two) times daily for 7 days. 05/09/17 05/16/17  Lewie Deman, Ambrose Finlandachel Morgan, MD    Family History No family history on file.  Social History Social History   Tobacco Use  . Smoking status: Current Every Day Smoker    Packs/day: 0.50    Types: Cigarettes, E-cigarettes  . Smokeless tobacco: Never Used  . Tobacco comment: Mostly vaping  Substance Use Topics  . Alcohol use: Yes    Comment: occ  . Drug use: Yes    Types: Marijuana     Allergies   Peanut-containing drug products and Penicillins   Review of Systems Review of Systems  Genitourinary: Positive for dysuria and vaginal discharge.   All other systems reviewed and are negative except that which was mentioned in HPI   Physical Exam Updated Vital Signs BP 121/83 (BP Location: Left Arm)   Pulse (!) 58   Temp 98.7 F (37.1 C) (Oral)   Resp 16   Ht 5\' 8"  (1.727 m)   Wt 70.3 kg (155 lb)   LMP 05/03/2017   SpO2 96%   BMI 23.57 kg/m   Physical Exam  Constitutional: She is oriented to person, place, and time. She appears well-developed and well-nourished. No distress.  HENT:  Head: Normocephalic and atraumatic.  Moist mucous membranes  Eyes: Conjunctivae are normal. Pupils  are equal, round, and reactive to light.  Neck: Neck supple.  Cardiovascular: Normal rate, regular rhythm and normal heart sounds.  No murmur heard. Pulmonary/Chest: Effort normal and breath sounds normal.  Abdominal: Soft. Bowel sounds are normal. She exhibits no distension. There is no tenderness.  Genitourinary:  Genitourinary Comments: Scant white discharge in vaginal vault, no cervical motion or adnexal tenderness, no cervical friability  Musculoskeletal: She exhibits no edema.  Neurological: She is alert and oriented to person, place, and time.  Fluent speech  Skin: Skin is warm and dry.  Psychiatric: She has a normal mood and affect. Judgment normal.  Nursing note and vitals  reviewed.  Chaperone was present during exam.   ED Treatments / Results  Labs (all labs ordered are listed, but only abnormal results are displayed) Labs Reviewed  WET PREP, GENITAL - Abnormal; Notable for the following components:      Result Value   WBC, Wet Prep HPF POC MANY (*)    All other components within normal limits  URINALYSIS, COMPLETE (UACMP) WITH MICROSCOPIC - Abnormal; Notable for the following components:   APPearance CLOUDY (*)    Leukocytes, UA TRACE (*)    Squamous Epithelial / LPF 6-30 (*)    Bacteria, UA MANY (*)    All other components within normal limits  URINE CULTURE  PREGNANCY, URINE  GC/CHLAMYDIA PROBE AMP ([place]) NOT AT [facility]    EKG  EKG Interpretation None       Radiology No results found.  Procedures Procedures (including critical care time)  Medications Ordered in ED Medications - No data to display   Initial Impression / Assessment and Plan / ED Course  I have reviewed the triage vital signs and the nursing notes.  Pertinent labs  that were available during my care of the patient were reviewed by me and considered in my medical decision making (see chart for details).     PT well appearing on exam. UA suggestive of infection especially given dysuria, urine culture sent. She requested bactrim as she has tolerated previously and has had some stomach upset with other antibiotics. Pelvic was not suggestive of cervicitis or PID. I did order GC/chlamydia but based on normal exam I do not feel she needs empiric treatment currently. Wet prep negative for BV or yeast.  I discussed return precautions including fever, abdominal pain, or severe back pain.  Final Clinical Impressions(s) / ED Diagnoses   Final diagnoses:  Acute cystitis without hematuria    ED Discharge Orders        Ordered    sulfamethoxazole-trimethoprim (BACTRIM DS,SEPTRA DS) 800-160 MG tablet  2 times daily     05/09/17 1055       Karrin Eisenmenger, Ambrose Finland,  MD 05/09/17 1105

## 2017-05-10 LAB — URINE CULTURE

## 2017-05-10 LAB — GC/CHLAMYDIA PROBE AMP (~~LOC~~) NOT AT ARMC
CHLAMYDIA, DNA PROBE: NEGATIVE
NEISSERIA GONORRHEA: NEGATIVE

## 2017-05-23 ENCOUNTER — Emergency Department (HOSPITAL_BASED_OUTPATIENT_CLINIC_OR_DEPARTMENT_OTHER)
Admission: EM | Admit: 2017-05-23 | Discharge: 2017-05-23 | Disposition: A | Discharge status: Home / Self Care | Payer: Self-pay | Attending: Emergency Medicine | Admitting: Emergency Medicine

## 2017-05-23 ENCOUNTER — Other Ambulatory Visit: Payer: Self-pay

## 2017-05-23 ENCOUNTER — Encounter (HOSPITAL_BASED_OUTPATIENT_CLINIC_OR_DEPARTMENT_OTHER): Payer: Self-pay | Admitting: Emergency Medicine

## 2017-05-23 DIAGNOSIS — B9689 Other specified bacterial agents as the cause of diseases classified elsewhere: Secondary | ICD-10-CM | POA: Insufficient documentation

## 2017-05-23 DIAGNOSIS — Z3202 Encounter for pregnancy test, result negative: Secondary | ICD-10-CM | POA: Insufficient documentation

## 2017-05-23 DIAGNOSIS — J45909 Unspecified asthma, uncomplicated: Secondary | ICD-10-CM | POA: Insufficient documentation

## 2017-05-23 DIAGNOSIS — Z91010 Allergy to peanuts: Secondary | ICD-10-CM | POA: Insufficient documentation

## 2017-05-23 DIAGNOSIS — Z79899 Other long term (current) drug therapy: Secondary | ICD-10-CM | POA: Insufficient documentation

## 2017-05-23 DIAGNOSIS — F17210 Nicotine dependence, cigarettes, uncomplicated: Secondary | ICD-10-CM | POA: Insufficient documentation

## 2017-05-23 DIAGNOSIS — N760 Acute vaginitis: Secondary | ICD-10-CM | POA: Insufficient documentation

## 2017-05-23 LAB — URINALYSIS, ROUTINE W REFLEX MICROSCOPIC
BILIRUBIN URINE: NEGATIVE
GLUCOSE, UA: NEGATIVE mg/dL
Ketones, ur: NEGATIVE mg/dL
NITRITE: NEGATIVE
PH: 6 (ref 5.0–8.0)
PROTEIN: 30 mg/dL — AB
Specific Gravity, Urine: >1.03 — ABNORMAL HIGH (ref 1.005–1.030)

## 2017-05-23 LAB — WET PREP, GENITAL
SPERM: NONE SEEN
TRICH WET PREP: NONE SEEN
YEAST WET PREP: NONE SEEN

## 2017-05-23 LAB — URINALYSIS, MICROSCOPIC (REFLEX)

## 2017-05-23 LAB — PREGNANCY, URINE: Preg Test, Ur: NEGATIVE

## 2017-05-23 MED ORDER — METRONIDAZOLE 500 MG PO TABS: 500.0000 mg | ORAL_TABLET | Freq: Two times a day (BID) | ORAL | 0 refills | Status: DC

## 2017-05-23 NOTE — ED Triage Notes (Addendum)
Patient reports she believes she has a yeast infection and a UTI.  Reports dysuria, vaginal itching, vaginal discharge.  Recently diagnosed with UTI and finished abx.

## 2017-05-23 NOTE — ED Provider Notes (Signed)
[facility] EMERGENCY DEPARTMENT Provider Note   CSN: [id number] Arrival date & time: 05/23/17  0845     History   Chief Complaint Chief Complaint  Patient presents with  . Urinary Tract Infection    HPI Cindy Bailey is a 47 y.o. female.  Patient is a 47 year old female who presents with vaginal itching and burning.  She recently was treated for urinary tract infection on February 17.  She completed a course of Bactrim.  She states that those symptoms got better and over the last 3-4 days she has noted some vaginal discharge with some itching and burning in her vaginal area.  She is concerned that she may have a yeast infection.  She is sexually active with one partner.  She denies abdominal pain.  She has some mild achiness across her lower back.  No fevers.  No nausea or vomiting.  She has not taken any over-the-counter medicines for the symptoms.      Past Medical History:  Diagnosis Date  . Anxiety   . Asthma   . Bacterial vaginosis   . PID (acute pelvic inflammatory disease)     There are no active problems to display for this patient.   Past Surgical History:  Procedure Laterality Date  . WISDOM TOOTH EXTRACTION      OB History    Gravida Para Term Preterm AB Living   0 0 0 0 0 0   SAB TAB Ectopic Multiple Live Births   0 0 0 0 0       Home Medications    Prior to Admission medications   Medication Sig Start Date End Date Taking? Authorizing Provider  busPIRone (BUSPAR) 15 MG tablet Take 15 mg 3 (three) times daily by mouth.    [provider]  ibuprofen (ADVIL,MOTRIN) 200 MG tablet Take 200 mg by mouth every 6 (six) hours as needed.    [provider]  metroNIDAZOLE (FLAGYL) 500 MG tablet Take 1 tablet (500 mg total) by mouth 2 (two) times daily. One po bid x 7 days 05/23/17   Rolan Bucco, MD  Multiple Vitamins-Minerals (MULTIVITAMIN ADULT PO) Take by mouth.    [provider]  Probiotic Product (SOLUBLE  FIBER/PROBIOTICS PO) Take by mouth.    [provider]    Family History History reviewed. No pertinent family history.  Social History Social History   Tobacco Use  . Smoking status: Current Every Day Smoker    Packs/day: 0.50    Types: Cigarettes, E-cigarettes  . Smokeless tobacco: Never Used  . Tobacco comment: Mostly vaping  Substance Use Topics  . Alcohol use: Yes    Comment: occ  . Drug use: Yes    Types: Marijuana     Allergies   Peanut-containing drug products and Penicillins   Review of Systems Review of Systems  Constitutional: Negative for chills, diaphoresis, fatigue and fever.  HENT: Negative for congestion, rhinorrhea and sneezing.   Eyes: Negative.   Respiratory: Negative for cough, chest tightness and shortness of breath.   Cardiovascular: Negative for chest pain and leg swelling.  Gastrointestinal: Negative for abdominal pain, blood in stool, diarrhea, nausea and vomiting.  Genitourinary: Positive for vaginal discharge. Negative for difficulty urinating, flank pain, frequency, hematuria and vaginal bleeding.  Musculoskeletal: Positive for back pain. Negative for arthralgias.  Skin: Negative for rash.  Neurological: Negative for dizziness, speech difficulty, weakness, numbness and headaches.     Physical Exam Updated Vital Signs BP 137/69 (BP Location: Right  Arm)   Pulse 77   Temp 98.4 F (36.9 C) (Oral)   Resp 18   Ht 5\' 8"  (1.727 m)   Wt 70.3 kg (155 lb)   LMP 05/03/2017   SpO2 98%   BMI 23.57 kg/m   Physical Exam  Constitutional: She is oriented to person, place, and time. She appears well-developed and well-nourished.  HENT:  Head: Normocephalic and atraumatic.  Eyes: Pupils are equal, round, and reactive to light.  Neck: Normal range of motion. Neck supple.  Cardiovascular: Normal rate, regular rhythm and normal heart sounds.  Pulmonary/Chest: Effort normal and breath sounds normal. No respiratory distress. She has no  wheezes. She has no rales. She exhibits no tenderness.  Abdominal: Soft. Bowel sounds are normal. There is no tenderness. There is no rebound and no guarding.  Genitourinary:  Genitourinary Comments: Small amount of white discharge.  No cervical motion tenderness, no adnexal tenderness, no rash or vesicles noted  Musculoskeletal: Normal range of motion. She exhibits no edema.  Lymphadenopathy:    She has no cervical adenopathy.  Neurological: She is alert and oriented to person, place, and time.  Skin: Skin is warm and dry. No rash noted.  Psychiatric: She has a normal mood and affect.     ED Treatments / Results  Labs (all labs ordered are listed, but only abnormal results are displayed) Labs Reviewed  WET PREP, GENITAL - Abnormal; Notable for the following components:      Result Value   Clue Cells Wet Prep HPF POC PRESENT (*)    WBC, Wet Prep HPF POC MANY (*)    All other components within normal limits  URINALYSIS, ROUTINE W REFLEX MICROSCOPIC - Abnormal; Notable for the following components:   APPearance CLOUDY (*)    Specific Gravity, Urine >1.030 (*)    Hgb urine dipstick SMALL (*)    Protein, ur 30 (*)    Leukocytes, UA SMALL (*)    All other components within normal limits  URINALYSIS, MICROSCOPIC (REFLEX) - Abnormal; Notable for the following components:   Bacteria, UA MANY (*)    Squamous Epithelial / LPF 6-30 (*)    All other components within normal limits  URINE CULTURE  PREGNANCY, URINE  GC/CHLAMYDIA PROBE AMP ([place]) NOT AT [facility]    EKG  EKG Interpretation None       Radiology No results found.  Procedures Procedures (including critical care time)  Medications Ordered in ED Medications - No data to display   Initial Impression / Assessment and Plan / ED Course  I have reviewed the triage vital signs and the nursing notes.  Pertinent labs & imaging results that were available during my care of the patient were reviewed by me and considered  in my medical decision making (see chart for details).     Patient is a 47 year old female who presents with some vaginal irritation.  She has positive clue cells on her wet prep.  Her urine analysis is equivocal.  It was sent for culture but at this point I will not treat her with antibiotics that she was just recently treated for a UTI.  Her last culture did not grow out a predominant organism.  She was discharged home in good condition.  She was started on Flagyl.  She was encouraged to follow-up with her OB/GYN if her symptoms are not improving.  Return precautions were given.  Final Clinical Impressions(s) / ED Diagnoses   Final diagnoses:  BV (bacterial vaginosis)  ED Discharge Orders        Ordered    metroNIDAZOLE (FLAGYL) 500 MG tablet  2 times daily     05/23/17 1011       Rolan BuccoBelfi, Davius Goudeau, MD 05/23/17 1012

## 2017-05-23 NOTE — ED Notes (Signed)
ED Provider at bedside. 

## 2017-05-24 LAB — URINE CULTURE

## 2017-05-24 LAB — GC/CHLAMYDIA PROBE AMP (~~LOC~~) NOT AT ARMC
Chlamydia: NEGATIVE
NEISSERIA GONORRHEA: NEGATIVE

## 2017-11-20 ENCOUNTER — Other Ambulatory Visit: Payer: Self-pay

## 2017-11-20 ENCOUNTER — Encounter (HOSPITAL_BASED_OUTPATIENT_CLINIC_OR_DEPARTMENT_OTHER): Payer: Self-pay | Admitting: *Deleted

## 2017-11-20 ENCOUNTER — Emergency Department (HOSPITAL_BASED_OUTPATIENT_CLINIC_OR_DEPARTMENT_OTHER)
Admission: EM | Admit: 2017-11-20 | Discharge: 2017-11-20 | Disposition: A | Discharge status: Home / Self Care | Payer: Self-pay | Attending: Emergency Medicine | Admitting: Emergency Medicine

## 2017-11-20 DIAGNOSIS — Z91010 Allergy to peanuts: Secondary | ICD-10-CM | POA: Insufficient documentation

## 2017-11-20 DIAGNOSIS — R350 Frequency of micturition: Secondary | ICD-10-CM | POA: Insufficient documentation

## 2017-11-20 DIAGNOSIS — F1210 Cannabis abuse, uncomplicated: Secondary | ICD-10-CM | POA: Insufficient documentation

## 2017-11-20 DIAGNOSIS — N390 Urinary tract infection, site not specified: Secondary | ICD-10-CM | POA: Insufficient documentation

## 2017-11-20 DIAGNOSIS — Z79899 Other long term (current) drug therapy: Secondary | ICD-10-CM | POA: Insufficient documentation

## 2017-11-20 DIAGNOSIS — F17210 Nicotine dependence, cigarettes, uncomplicated: Secondary | ICD-10-CM | POA: Insufficient documentation

## 2017-11-20 DIAGNOSIS — J45909 Unspecified asthma, uncomplicated: Secondary | ICD-10-CM | POA: Insufficient documentation

## 2017-11-20 DIAGNOSIS — F17290 Nicotine dependence, other tobacco product, uncomplicated: Secondary | ICD-10-CM | POA: Insufficient documentation

## 2017-11-20 LAB — URINALYSIS, MICROSCOPIC (REFLEX)

## 2017-11-20 LAB — URINALYSIS, ROUTINE W REFLEX MICROSCOPIC
BILIRUBIN URINE: NEGATIVE
Glucose, UA: NEGATIVE mg/dL
KETONES UR: NEGATIVE mg/dL
Nitrite: NEGATIVE
PH: 7 (ref 5.0–8.0)
PROTEIN: NEGATIVE mg/dL
Specific Gravity, Urine: 1.02 (ref 1.005–1.030)

## 2017-11-20 LAB — PREGNANCY, URINE: Preg Test, Ur: NEGATIVE

## 2017-11-20 MED ORDER — NITROFURANTOIN MONOHYD MACRO 100 MG PO CAPS: 100.0000 mg | ORAL_CAPSULE | Freq: Two times a day (BID) | ORAL | 0 refills | Status: DC

## 2017-11-20 NOTE — Discharge Instructions (Signed)
You have a urinary tract infection.  Take antibiotic twice a day for the next 5 days until it is completely gone.  Return to the ER if you have any new or concerning symptoms like vomiting that will not stop or fever.

## 2017-11-20 NOTE — ED Notes (Addendum)
Pt reports recently treated for trichomoniasis. Partner was treated as well, but a third party individual did not get treated. Pt currently takes Metronidazole once a month for BV suppression therapy. Pt reports that she will be going to the health department to get retested for Trichomoniasis.

## 2017-11-20 NOTE — ED Provider Notes (Signed)
[facility] EMERGENCY DEPARTMENT Provider Note   CSN: [id number] Arrival date & time: 11/20/17  1441     History   Chief Complaint Chief Complaint  Patient presents with  . Dysuria    HPI Cindy CraftJennifer A Bailey is a 47 y.o. female.  HPI   Cindy BinetJennifer Bailey is a 47yo female with a history of recurrent BV, asthma and anxiety who presents to the Emergency Dept for evaluation of dysuria and urinary frequency. Symptoms started today. She has a history of UTIs in the fast and this feels similar. States she has mild intermittent suprapubic cramping, believes she is about to start her menstrual cycle. LMP "a little over a month ago." She states she is going through menopause and has irregular cycles. She denies fever, chills, flank pain, hematuria, abdominal pain, n/v, vaginal discharge, diarrhea, lightheadedness or syncope.  Past Medical History:  Diagnosis Date  . Anxiety   . Asthma   . Bacterial vaginosis   . PID (acute pelvic inflammatory disease)     There are no active problems to display for this patient.   Past Surgical History:  Procedure Laterality Date  . WISDOM TOOTH EXTRACTION       OB History    Gravida  0   Para  0   Term  0   Preterm  0   AB  0   Living  0     SAB  0   TAB  0   Ectopic  0   Multiple  0   Live Births  0            Home Medications    Prior to Admission medications   Medication Sig Start Date End Date Taking? Authorizing Provider  busPIRone (BUSPAR) 15 MG tablet Take 15 mg 3 (three) times daily by mouth.    [provider]  ibuprofen (ADVIL,MOTRIN) 200 MG tablet Take 200 mg by mouth every 6 (six) hours as needed.    [provider]  metroNIDAZOLE (FLAGYL) 500 MG tablet Take 1 tablet (500 mg total) by mouth 2 (two) times daily. One po bid x 7 days 05/23/17   Rolan BuccoBelfi, Melanie, MD  Multiple Vitamins-Minerals (MULTIVITAMIN ADULT PO) Take by mouth.    [provider]  Probiotic Product (SOLUBLE  FIBER/PROBIOTICS PO) Take by mouth.    [provider]    Family History History reviewed. No pertinent family history.  Social History Social History   Tobacco Use  . Smoking status: Current Every Day Smoker    Packs/day: 1.00    Types: Cigarettes, E-cigarettes  . Smokeless tobacco: Never Used  . Tobacco comment: Mostly vaping  Substance Use Topics  . Alcohol use: Yes    Comment: occ  . Drug use: Yes    Types: Marijuana     Allergies   Peanut-containing drug products and Penicillins   Review of Systems Review of Systems  Constitutional: Negative for chills and fever.  Respiratory: Negative for shortness of breath.   Cardiovascular: Negative for chest pain.  Gastrointestinal: Negative for abdominal pain, nausea and vomiting.  Genitourinary: Positive for dysuria and frequency. Negative for flank pain, hematuria, vaginal bleeding and vaginal discharge.  Neurological: Negative for syncope and light-headedness.  All other systems reviewed and are negative.    Physical Exam Updated Vital Signs BP 137/78   Pulse 70   Temp 98.3 F (36.8 C)   Resp 18   Ht 5' (1.524 m)   Wt 71.7 kg   LMP  10/06/2017   SpO2 100%   BMI 30.86 kg/m   Physical Exam  Constitutional: She is oriented to person, place, and time. She appears well-developed and well-nourished. No distress.  NAD, non-toxic appearing.   HENT:  Head: Normocephalic and atraumatic.  Mouth/Throat: Oropharynx is clear and moist.  Mucous membranes moist.   Eyes: Pupils are equal, round, and reactive to light. Conjunctivae are normal. Right eye exhibits no discharge. Left eye exhibits no discharge.  Cardiovascular: Normal rate, regular rhythm and intact distal pulses.  Pulmonary/Chest: Effort normal and breath sounds normal. No stridor. No respiratory distress. She has no wheezes. She has no rales.  Abdominal:  Abdomen soft and non-distended. Bowel sounds normoactive. No abdominal tenderness with light and  deep palpation. No CVA tenderness.   Musculoskeletal: Normal range of motion.  Neurological: She is alert and oriented to person, place, and time. Coordination normal.  Skin: Skin is warm and dry. She is not diaphoretic.  Psychiatric: She has a normal mood and affect. Her behavior is normal.  Nursing note and vitals reviewed.    ED Treatments / Results  Labs (all labs ordered are listed, but only abnormal results are displayed) Labs Reviewed  URINALYSIS, ROUTINE W REFLEX MICROSCOPIC - Abnormal; Notable for the following components:      Result Value   APPearance CLOUDY (*)    Hgb urine dipstick TRACE (*)    Leukocytes, UA SMALL (*)    All other components within normal limits  URINALYSIS, MICROSCOPIC (REFLEX) - Abnormal; Notable for the following components:   Bacteria, UA MANY (*)    All other components within normal limits  URINE CULTURE  PREGNANCY, URINE    EKG None  Radiology No results found.  Procedures Procedures (including critical care time)  Medications Ordered in ED Medications - No data to display   Initial Impression / Assessment and Plan / ED Course  I have reviewed the triage vital signs and the nursing notes.  Pertinent labs & imaging results that were available during my care of the patient were reviewed by me and considered in my medical decision making (see chart for details).    Pt has been diagnosed with a UTI. Pt is afebrile, no CVA tenderness, normotensive, and denies N/V. Pt to be dc home with antibiotics and instructions to follow up with PCP if symptoms persist.  Final Clinical Impressions(s) / ED Diagnoses   Final diagnoses:  Lower urinary tract infectious disease    ED Discharge Orders         Ordered    nitrofurantoin, macrocrystal-monohydrate, (MACROBID) 100 MG capsule  2 times daily     11/20/17 1552           Kellie Shropshire, PA-C 11/20/17 1552    Shaune Pollack, MD 11/21/17 1158

## 2017-11-20 NOTE — ED Triage Notes (Signed)
Pt c/o painful freq urination x 1 day 

## 2017-11-23 LAB — URINE CULTURE

## 2017-12-16 ENCOUNTER — Emergency Department (HOSPITAL_BASED_OUTPATIENT_CLINIC_OR_DEPARTMENT_OTHER)
Admission: EM | Admit: 2017-12-16 | Discharge: 2017-12-16 | Disposition: A | Discharge status: Home / Self Care | Payer: Self-pay | Attending: Emergency Medicine | Admitting: Emergency Medicine

## 2017-12-16 ENCOUNTER — Encounter (HOSPITAL_BASED_OUTPATIENT_CLINIC_OR_DEPARTMENT_OTHER): Payer: Self-pay | Admitting: *Deleted

## 2017-12-16 ENCOUNTER — Other Ambulatory Visit: Payer: Self-pay

## 2017-12-16 DIAGNOSIS — Z5321 Procedure and treatment not carried out due to patient leaving prior to being seen by health care provider: Secondary | ICD-10-CM | POA: Insufficient documentation

## 2017-12-16 DIAGNOSIS — R21 Rash and other nonspecific skin eruption: Secondary | ICD-10-CM | POA: Insufficient documentation

## 2017-12-16 NOTE — ED Triage Notes (Signed)
Pt c/o rash to rectum x 2 weeks

## 2018-04-02 ENCOUNTER — Other Ambulatory Visit: Payer: Self-pay

## 2018-04-02 ENCOUNTER — Emergency Department (HOSPITAL_BASED_OUTPATIENT_CLINIC_OR_DEPARTMENT_OTHER)
Admission: EM | Admit: 2018-04-02 | Discharge: 2018-04-02 | Disposition: A | Discharge status: Home / Self Care | Payer: Self-pay | Attending: Emergency Medicine | Admitting: Emergency Medicine

## 2018-04-02 ENCOUNTER — Encounter (HOSPITAL_BASED_OUTPATIENT_CLINIC_OR_DEPARTMENT_OTHER): Payer: Self-pay | Admitting: Emergency Medicine

## 2018-04-02 DIAGNOSIS — F17210 Nicotine dependence, cigarettes, uncomplicated: Secondary | ICD-10-CM | POA: Insufficient documentation

## 2018-04-02 DIAGNOSIS — Z79899 Other long term (current) drug therapy: Secondary | ICD-10-CM | POA: Insufficient documentation

## 2018-04-02 DIAGNOSIS — R300 Dysuria: Secondary | ICD-10-CM | POA: Insufficient documentation

## 2018-04-02 DIAGNOSIS — B353 Tinea pedis: Secondary | ICD-10-CM | POA: Insufficient documentation

## 2018-04-02 DIAGNOSIS — Z91010 Allergy to peanuts: Secondary | ICD-10-CM | POA: Insufficient documentation

## 2018-04-02 DIAGNOSIS — J45909 Unspecified asthma, uncomplicated: Secondary | ICD-10-CM | POA: Insufficient documentation

## 2018-04-02 LAB — URINALYSIS, ROUTINE W REFLEX MICROSCOPIC
BILIRUBIN URINE: NEGATIVE
GLUCOSE, UA: NEGATIVE mg/dL
HGB URINE DIPSTICK: NEGATIVE
Ketones, ur: NEGATIVE mg/dL
Nitrite: NEGATIVE
PH: 5.5 (ref 5.0–8.0)
Protein, ur: NEGATIVE mg/dL
Specific Gravity, Urine: >1.03 — ABNORMAL HIGH (ref 1.005–1.030)

## 2018-04-02 LAB — URINALYSIS, MICROSCOPIC (REFLEX)

## 2018-04-02 LAB — PREGNANCY, URINE: Preg Test, Ur: NEGATIVE

## 2018-04-02 MED ORDER — TERBINAFINE HCL 1 % EX CREA: 1.0000 "application " | TOPICAL_CREAM | Freq: Two times a day (BID) | CUTANEOUS | 0 refills | Status: AC

## 2018-04-02 NOTE — ED Notes (Signed)
Pt sts she would like provider to look at a red spot on her hand before she leaves; EDP notified.

## 2018-04-02 NOTE — Discharge Instructions (Addendum)
The hospital will contact you if your urine culture is positive and requires antibiotics. Apply the cream to your feet, twice daily for 1 week. Avoid your feet being too moist, as this increases the likelihood of your rash.

## 2018-04-02 NOTE — ED Triage Notes (Addendum)
Reports dysuria with left flank pain x 2 days.  Reports she believes she has a UTI.  Additionally c/o foot odor and would like to be checked for athletes foot.

## 2018-04-02 NOTE — ED Provider Notes (Signed)
[facility] EMERGENCY DEPARTMENT Provider Note   CSN: [id number] Arrival date & time: 04/02/18  1108     History   Chief Complaint Chief Complaint  Patient presents with  . Dysuria    HPI Cindy Bailey is a 48 y.o. female with past medical history of recurrent bacterial vaginosis, asthma, PID, presenting to the emergency department with complaint of dysuria x2 days.  Patient states she has a burning sensation with urination and also when she is not urinating around her urethra.  She has a mild odor to her urine.  She does endorse some low back pain however she did some lifting last week and has been sore from that, does not think her back pain is attributed to her urinary symptoms.  She denies abdominal pain,, fever.  LMP was mid December.  No history of pyelonephritis or nephrolithiasis.  She is currently sexually active with both men and women, does use protection.  She has recurrent bacterial vaginosis and treated recently a few days ago with Flagyl.  She states she is prescribed Diflucan as well and plans to take that today. Patient also with second complaint of a somewhat itchy rash to bilateral feet.  Rashes on the medial aspect.  States she does have eczema and has been applying hydrocortisone cream how ever this rash appears different.  Rash is not painful, no drainage.  No recent medications that were new to her. The history is provided by the patient.    Past Medical History:  Diagnosis Date  . Anxiety   . Asthma   . Bacterial vaginosis   . PID (acute pelvic inflammatory disease)     There are no active problems to display for this patient.   Past Surgical History:  Procedure Laterality Date  . WISDOM TOOTH EXTRACTION       OB History    Gravida  0   Para  0   Term  0   Preterm  0   AB  0   Living  0     SAB  0   TAB  0   Ectopic  0   Multiple  0   Live Births  0            Home Medications    Prior to Admission medications     Medication Sig Start Date End Date Taking? Authorizing Provider  busPIRone (BUSPAR) 15 MG tablet Take 15 mg 3 (three) times daily by mouth.    [provider]  ibuprofen (ADVIL,MOTRIN) 200 MG tablet Take 200 mg by mouth every 6 (six) hours as needed.    [provider]  Multiple Vitamins-Minerals (MULTIVITAMIN ADULT PO) Take by mouth.    [provider]  Probiotic Product (SOLUBLE FIBER/PROBIOTICS PO) Take by mouth.    [provider]  terbinafine (LAMISIL AT) 1 % cream Apply 1 application topically 2 (two) times daily for 7 days. 04/02/18 04/09/18  , [place] N, PA-C    Family History History reviewed. No pertinent family history.  Social History Social History   Tobacco Use  . Smoking status: Current Every Day Smoker    Packs/day: 1.00    Types: Cigarettes, E-cigarettes  . Smokeless tobacco: Never Used  . Tobacco comment: Mostly vaping  Substance Use Topics  . Alcohol use: Yes    Comment: occ  . Drug use: Yes    Types: Marijuana     Allergies   Peanut-containing drug products and Penicillins   Review of  Systems Review of Systems  Constitutional: Negative for fever.  Gastrointestinal: Negative for abdominal pain, nausea and vomiting.  Genitourinary: Positive for dysuria. Negative for flank pain, frequency, vaginal bleeding and vaginal discharge.  Skin: Positive for rash.  All other systems reviewed and are negative.    Physical Exam Updated Vital Signs BP 118/69 (BP Location: Right Arm)   Pulse 74   Temp 97.8 F (36.6 C) (Oral)   Resp 16   Ht 5\' 8"  (1.727 m)   Wt 70.3 kg   LMP 03/22/2018 (Approximate)   SpO2 98%   BMI 23.57 kg/m   Physical Exam Vitals signs and nursing note reviewed.  Constitutional:      General: She is not in acute distress.    Appearance: She is well-developed. She is not ill-appearing.  HENT:     Head: Normocephalic and atraumatic.  Eyes:     Conjunctiva/sclera: Conjunctivae normal.   Cardiovascular:     Rate and Rhythm: Normal rate and regular rhythm.  Pulmonary:     Effort: Pulmonary effort is normal.     Breath sounds: Normal breath sounds.  Abdominal:     General: Bowel sounds are normal.     Palpations: Abdomen is soft. There is no mass.     Tenderness: There is no abdominal tenderness. There is no guarding or rebound.  Skin:    General: Skin is warm.     Comments: Multiple erythematous round rashes with a raised border with scaling and small papules in the center to the medial aspect of both feet, right worse than left. Rash appears dry. No fluctuance or drainage. Nontender.   Neurological:     Mental Status: She is alert.  Psychiatric:        Behavior: Behavior normal.      ED Treatments / Results  Labs (all labs ordered are listed, but only abnormal results are displayed) Labs Reviewed  URINALYSIS, ROUTINE W REFLEX MICROSCOPIC - Abnormal; Notable for the following components:      Result Value   Specific Gravity, Urine >1.030 (*)    Leukocytes, UA TRACE (*)    All other components within normal limits  URINALYSIS, MICROSCOPIC (REFLEX) - Abnormal; Notable for the following components:   Bacteria, UA FEW (*)    All other components within normal limits  URINE CULTURE  PREGNANCY, URINE    EKG None  Radiology No results found.  Procedures Procedures (including critical care time)  Medications Ordered in ED Medications - No data to display   Initial Impression / Assessment and Plan / ED Course  I have reviewed the triage vital signs and the nursing notes.  Pertinent labs & imaging results that were available during my care of the patient were reviewed by me and considered in my medical decision making (see chart for details).     Patient presenting with dysuria and burning sensation near her urethra.  History of recurrent BV, self treated with recurring prescription of Flagyl a few days ago.  She is also prescribed Diflucan to take a  few days after her Flagyl for subsequent yeast infections and intends to take that today.  Denies vaginal discharge or pelvic complaints.  No abdominal complaints, flank pain, nausea, vomiting, or fevers.  Abdominal exam is reassuring.  Patient declined pelvic exam.  UA with trace leuks and few bacteria, many squamous. Urine is consistent with contaminant and does not appear to have obvious infection.  This was discussed with Dr. Criss Alvine, culture sent.  Patient also  with itchy rash to bilateral feet, consistent with tinea rash.  Will treat with Lamisil and and instructed PCP follow-up.  Return precautions discussed.  Patient agreeable to plan and safe for discharge.  Discussed results, findings, treatment and follow up. Patient advised of return precautions. Patient verbalized understanding and agreed with plan.   Final Clinical Impressions(s) / ED Diagnoses   Final diagnoses:  Tinea pedis of both feet  Dysuria    ED Discharge Orders         Ordered    terbinafine (LAMISIL AT) 1 % cream  2 times daily     04/02/18 1214           , [place] N, [place]PA-C 04/02/18 1445    Pricilla LovelessGoldston, Scott, MD 04/03/18 [phone]

## 2018-04-03 LAB — URINE CULTURE: Culture: NO GROWTH

## 2018-06-01 ENCOUNTER — Emergency Department (HOSPITAL_BASED_OUTPATIENT_CLINIC_OR_DEPARTMENT_OTHER)
Admission: EM | Admit: 2018-06-01 | Discharge: 2018-06-01 | Disposition: A | Discharge status: Home / Self Care | Payer: Self-pay | Attending: Emergency Medicine | Admitting: Emergency Medicine

## 2018-06-01 ENCOUNTER — Other Ambulatory Visit: Payer: Self-pay

## 2018-06-01 ENCOUNTER — Encounter (HOSPITAL_BASED_OUTPATIENT_CLINIC_OR_DEPARTMENT_OTHER): Payer: Self-pay | Admitting: Emergency Medicine

## 2018-06-01 DIAGNOSIS — Z79899 Other long term (current) drug therapy: Secondary | ICD-10-CM | POA: Insufficient documentation

## 2018-06-01 DIAGNOSIS — F17210 Nicotine dependence, cigarettes, uncomplicated: Secondary | ICD-10-CM | POA: Insufficient documentation

## 2018-06-01 DIAGNOSIS — Z91010 Allergy to peanuts: Secondary | ICD-10-CM | POA: Insufficient documentation

## 2018-06-01 DIAGNOSIS — J029 Acute pharyngitis, unspecified: Secondary | ICD-10-CM

## 2018-06-01 DIAGNOSIS — J020 Streptococcal pharyngitis: Secondary | ICD-10-CM | POA: Insufficient documentation

## 2018-06-01 DIAGNOSIS — J45909 Unspecified asthma, uncomplicated: Secondary | ICD-10-CM | POA: Insufficient documentation

## 2018-06-01 LAB — GROUP A STREP BY PCR: GROUP A STREP BY PCR: DETECTED — AB

## 2018-06-01 MED ORDER — ACETAMINOPHEN 325 MG PO TABS
650.0000 mg | ORAL_TABLET | Freq: Once | ORAL | Status: AC
  Administered 2018-06-01: 650 mg via ORAL
  Filled 2018-06-01: qty 2

## 2018-06-01 MED ORDER — AZITHROMYCIN 250 MG PO TABS: 250.0000 mg | ORAL_TABLET | Freq: Every day | ORAL | 0 refills | Status: DC

## 2018-06-01 MED FILL — AZITHROMYCIN 250 MG TABLET: 250 | 5 days supply | Qty: 6 | Fill #0

## 2018-06-01 NOTE — Discharge Instructions (Addendum)
You have been seen today for sore throat. Please read and follow all provided instructions.   1. Medications: z pack (antibiotic), usual home medications 2. Treatment: rest, drink plenty of fluids 3. Follow Up: Please follow up with your primary doctor in 2 days for discussion of your diagnoses and further evaluation after today's visit; if you do not have a primary care doctor use the resource guide provided to find one; Please return to the ER for any new or worsening symptoms. Please obtain all of your results from medical records or have your doctors office obtain the results - share them with your doctor - you should be seen at your doctors office. Call today to arrange your follow up.   Take medications as prescribed. Please review all of the medicines and only take them if you do not have an allergy to them. Return to the emergency room for worsening condition or new concerning symptoms. Follow up with your regular doctor. If you don't have a regular doctor use one of the numbers below to establish a primary care doctor.  Please be aware that if you are taking birth control pills, taking other prescriptions, ESPECIALLY ANTIBIOTICS may make the birth control ineffective - if this is the case, either do not engage in sexual activity or use alternative methods of birth control such as condoms until you have finished the medicine and your family doctor says it is OK to restart them. If you are on a blood thinner such as COUMADIN, be aware that any other medicine that you take may cause the coumadin to either work too much, or not enough - you should have your coumadin level rechecked in next 7 days if this is the case.  ?  It is also a possibility that you have an allergic reaction to any of the medicines that you have been prescribed - Everybody reacts differently to medications and while MOST people have no trouble with most medicines, you may have a reaction such as nausea, vomiting, rash, swelling,  shortness of breath. If this is the case, please stop taking the medicine immediately and contact your physician.  ?  You should return to the ER if you develop severe or worsening symptoms.   Emergency Department Resource Guide 1) Find a Doctor and Pay Out of Pocket Although you won't have to find out who is covered by your insurance plan, it is a good idea to ask around and get recommendations. You will then need to call the office and see if the doctor you have chosen will accept you as a new patient and what types of options they offer for patients who are self-pay. Some doctors offer discounts or will set up payment plans for their patients who do not have insurance, but you will need to ask so you aren't surprised when you get to your appointment.  2) Contact Your Local Health Department Not all health departments have doctors that can see patients for sick visits, but many do, so it is worth a call to see if yours does. If you don't know where your local health department is, you can check in your phone book. The CDC also has a tool to help you locate your state's health department, and many state websites also have listings of all of their local health departments.  3) Find a Walk-in Clinic If your illness is not likely to be very severe or complicated, you may want to try a walk in clinic. These are popping  up all over the country in pharmacies, drugstores, and shopping centers. They're usually staffed by nurse practitioners or physician assistants that have been trained to treat common illnesses and complaints. They're usually fairly quick and inexpensive. However, if you have serious medical issues or chronic medical problems, these are probably not your best option.  No Primary Care Doctor: Call Health Connect at  [phone] - they can help you locate a primary care doctor that  accepts your insurance, provides certain services, etc. Physician Referral [phone]  Emergency  Department Resource Guide 1) Find a Doctor and Pay Out of Pocket Although you won't have to find out who is covered by your insurance plan, it is a good idea to ask around and get recommendations. You will then need to call the office and see if the doctor you have chosen will accept you as a new patient and what types of options they offer for patients who are self-pay. Some doctors offer discounts or will set up payment plans for their patients who do not have insurance, but you will need to ask so you aren't surprised when you get to your appointment.  2) Contact Your Local Health Department Not all health departments have doctors that can see patients for sick visits, but many do, so it is worth a call to see if yours does. If you don't know where your local health department is, you can check in your phone book. The CDC also has a tool to help you locate your state's health department, and many state websites also have listings of all of their local health departments.  3) Find a [facility] If your illness is not likely to be very severe or complicated, you may want to try a walk in clinic. These are popping up all over the country in pharmacies, drugstores, and shopping centers. They're usually staffed by nurse practitioners or physician assistants that have been trained to treat common illnesses and complaints. They're usually fairly quick and inexpensive. However, if you have serious medical issues or chronic medical problems, these are probably not your best option.  No Primary Care Doctor: Call Health Connect at  [phone] - they can help you locate a primary care doctor that  accepts your insurance, provides certain services, etc. Physician Referral Service- [phone]  Chronic Pain Problems: Organization         Address  Phone   Notes  [facility]  [phone] Patients need to be referred by their primary care doctor.   Medication  Assistance: Organization         Address  Phone   Notes  [facility] Medication [facility] [address] [place], [place] [address] [phone] --Must be a resident of [facility] -- Must have NO insurance coverage whatsoever (no Medicaid/ Medicare, etc.) -- The pt. MUST have a primary care doctor that directs their care regularly and follows them in the community   [facility]  [phone]   [employer]  [phone]    Agencies that provide inexpensive medical care: Organization         Address  Phone   Notes  [place]  [phone]   [facility]    [phone]   [facility] [place],  [address] [phone]   [place] [address], [place] [phone]   [facility]    [phone]  [facility]    [phone]   [facility]  [address], [place] Phone:  [phone], Fax:  [phone] Hours of Operation:  9 am - 6 pm, M-F.  Also accepts Medicaid/Medicare and self-pay.  [facility]  [address], [place] Phone: [phone], Fax: [phone]. Hours of Operation:  8:30 am - 5:30 pm, M-F.  Also accepts Medicaid and self-pay.  [facility] [place] [address] [place] Phone: [phone]   [facility] [address] Natasha Bence [place], [place] [phone], Ext. [phone] Mondays & Thursdays: 7-9 AM.  First 15 patients are seen on a first come, first serve basis.    Medicaid-accepting [facility] Providers:  Organization         Address  Phone   Notes  [facility] [address],  [phone] Also accepts self-pay patients.  [facility] [address] Laurell Josephs [place], [place]  [phone]   [facility] [address], [place]  [phone]   [facility] [address], [place] [phone]   Renaye Rakers [address], [place]   [phone] Only accepts [place] patients after they have their name applied to their card.   Self-Pay (no insurance) in [facility]:  Organization         Address  Phone   Notes  Sickle Cell Patients, [facility] [address] [place], [place] [phone]   [facility] [address] [place] [address] [place], [place] [phone]   [facility]  [address], [place] [phone]   [facility]/Dr. Osei-Bonsu  [address] [place] or [address], [place] [phone] Phone number for both [place] and [place] locations is the same.  [facility] and [facility] [address], [place] [phone]   [facility] [address] [place] or [address] [phone]   [facility] [address] [place] [phone], phone; [phone], fax Sees patients 1st and 3rd Saturday of every month.  Must not qualify for public or private insurance (i.e. Medicaid, Medicare, [facility] [employer] Choice, Veterans' Benefits)  Household income should be no more than 200% of the poverty level The clinic cannot treat you if you are pregnant or think you are pregnant  Sexually transmitted diseases are not treated at the clinic.

## 2018-06-01 NOTE — ED Triage Notes (Signed)
Reports sore throat which began yesterday.  Denies fevers.

## 2018-06-01 NOTE — ED Provider Notes (Signed)
[facility] EMERGENCY DEPARTMENT Provider Note   CSN: [id number] Arrival date & time: 06/01/18  1049    History   Chief Complaint Chief Complaint  Patient presents with  . Sore Throat    HPI Cindy Bailey is a 48 y.o. female with a PMH of strep pharyngitis, anxiety, asthma, and PID presenting with a constant sore throat onset last night. Patient states nothing makes symptoms worse. Patient reports lymphadenopathy and denies cough. Patient states she has had similar symptoms with previous strep pharyngitis infections. Patient states she has tried ibuprofen with partial relief. Patient denies congestion, nausea, vomiting, abdominal pain, diarrhea, shortness of breath, headaches, or body aches. Patient denies sick contacts. Patient reports she stopped smoking within the last week and stated using a vaporizer. Patient reports a subjective fever last night. Patient states she is able to eat and drink without difficulty.     HPI  Past Medical History:  Diagnosis Date  . Anxiety   . Asthma   . Bacterial vaginosis   . PID (acute pelvic inflammatory disease)     There are no active problems to display for this patient.   Past Surgical History:  Procedure Laterality Date  . WISDOM TOOTH EXTRACTION       OB History    Gravida  0   Para  0   Term  0   Preterm  0   AB  0   Living  0     SAB  0   TAB  0   Ectopic  0   Multiple  0   Live Births  0            Home Medications    Prior to Admission medications   Medication Sig Start Date End Date Taking? Authorizing Provider  buPROPion (WELLBUTRIN XL) 150 MG 24 hr tablet Take 150 mg by mouth daily.   Yes [provider]  azithromycin (ZITHROMAX) 250 MG tablet Take 1 tablet (250 mg total) by mouth daily. Take first 2 tablets together, then 1 every day until finished. 06/01/18   Carlyle Basques P, PA-C  busPIRone (BUSPAR) 15 MG tablet Take 15 mg 3 (three) times daily by mouth.    [provider]  ibuprofen (ADVIL,MOTRIN) 200 MG tablet Take 200 mg by mouth every 6 (six) hours as needed.    [provider]  Multiple Vitamins-Minerals (MULTIVITAMIN ADULT PO) Take by mouth.    [provider]  Probiotic Product (SOLUBLE FIBER/PROBIOTICS PO) Take by mouth.    [provider]    Family History History reviewed. No pertinent family history.  Social History Social History   Tobacco Use  . Smoking status: Current Every Day Smoker    Packs/day: 1.00    Types: Cigarettes, E-cigarettes  . Smokeless tobacco: Never Used  . Tobacco comment: Mostly vaping  Substance Use Topics  . Alcohol use: Yes    Comment: occ  . Drug use: Yes    Types: Marijuana     Allergies   Peanut-containing drug products and Penicillins   Review of Systems Review of Systems  Constitutional: Positive for fever. Negative for activity change, appetite change and fatigue.  HENT: Positive for sore throat. Negative for congestion, ear pain, postnasal drip and rhinorrhea.   Eyes: Negative for pain, redness and itching.  Respiratory: Negative for cough and shortness of breath.   Cardiovascular: Negative for chest pain.  Gastrointestinal: Negative for abdominal pain, diarrhea, nausea and vomiting.  Musculoskeletal: Negative for  myalgias.  Skin: Negative for rash.  Allergic/Immunologic: Negative for environmental allergies and immunocompromised state.  Neurological: Negative for dizziness, weakness and headaches.  Hematological: Positive for adenopathy.     Physical Exam Updated Vital Signs BP 136/67 (BP Location: Left Arm)   Pulse 69   Temp 97.9 F (36.6 C) (Oral)   Resp 16   Ht 5\' 7"  (1.702 m)   Wt 69.4 kg   LMP 05/10/2018 (Approximate)   SpO2 100%   BMI 23.96 kg/m   Physical Exam Vitals signs and nursing note reviewed.  Constitutional:      General: She is not in acute distress.    Appearance: She is well-developed. She is not diaphoretic.  HENT:      Head: Normocephalic and atraumatic.     Right Ear: Tympanic membrane, ear canal and external ear normal. No middle ear effusion.     Left Ear: Tympanic membrane, ear canal and external ear normal.  No middle ear effusion.     Nose: Mucosal edema present. No congestion or rhinorrhea.     Mouth/Throat:     Mouth: Mucous membranes are moist.     Pharynx: Uvula midline. Posterior oropharyngeal erythema present. No oropharyngeal exudate.     Tonsils: No tonsillar exudate or tonsillar abscesses. Swelling: 1+ on the right. 1+ on the left.  Eyes:     General:        Right eye: No discharge.        Left eye: No discharge.     Conjunctiva/sclera: Conjunctivae normal.  Neck:     Musculoskeletal: Normal range of motion and neck supple.  Cardiovascular:     Rate and Rhythm: Normal rate and regular rhythm.     Heart sounds: Normal heart sounds. No murmur. No friction rub. No gallop.   Pulmonary:     Effort: Pulmonary effort is normal. No respiratory distress.     Breath sounds: Normal breath sounds. No wheezing or rales.  Abdominal:     Palpations: Abdomen is soft.     Tenderness: There is no abdominal tenderness.  Musculoskeletal: Normal range of motion.  Lymphadenopathy:     Cervical: Cervical adenopathy present.  Skin:    Findings: No rash.  Neurological:     Mental Status: She is alert and oriented to person, place, and time.      ED Treatments / Results  Labs (all labs ordered are listed, but only abnormal results are displayed) Labs Reviewed  GROUP A STREP BY PCR - Abnormal; Notable for the following components:      Result Value   Group A Strep by PCR DETECTED (*)    All other components within normal limits    EKG None  Radiology No results found.  Procedures Procedures (including critical care time)  Medications Ordered in ED Medications  acetaminophen (TYLENOL) tablet 650 mg (650 mg Oral Given 06/01/18 1127)     Initial Impression / Assessment and Plan / ED  Course  I have reviewed the triage vital signs and the nursing notes.  Pertinent labs & imaging results that were available during my care of the patient were reviewed by me and considered in my medical decision making (see chart for details).       Patient presents with sore throat. Patient reports fever. Physical exam reveals pharyngeal erythema, mild tonsillar edema, and cervical lymphadenopathy. Strep test is positive. Presentation non concerning for PTA or RPA. No trismus or uvula deviation. Specific return precautions discussed. Pt able to drink  water in ED without difficulty with intact air way. Prescribed z pack due to allergy to penicillin. Recommended PCP follow up. Patient states she understands and agrees with plan.   Final Clinical Impressions(s) / ED Diagnoses   Final diagnoses:  Sore throat  Strep pharyngitis    ED Discharge Orders         Ordered    azithromycin (ZITHROMAX) 250 MG tablet  Daily     06/01/18 1207           Carlyle Basques [place], [place] 06/01/18 1209    Vanetta Mulders, MD 06/02/18 1734

## 2018-06-29 ENCOUNTER — Emergency Department (HOSPITAL_BASED_OUTPATIENT_CLINIC_OR_DEPARTMENT_OTHER)
Admission: EM | Admit: 2018-06-29 | Discharge: 2018-06-29 | Disposition: A | Discharge status: Home / Self Care | Payer: Self-pay | Attending: Emergency Medicine | Admitting: Emergency Medicine

## 2018-06-29 ENCOUNTER — Encounter (HOSPITAL_BASED_OUTPATIENT_CLINIC_OR_DEPARTMENT_OTHER): Payer: Self-pay

## 2018-06-29 ENCOUNTER — Other Ambulatory Visit: Payer: Self-pay

## 2018-06-29 DIAGNOSIS — Z79899 Other long term (current) drug therapy: Secondary | ICD-10-CM | POA: Insufficient documentation

## 2018-06-29 DIAGNOSIS — F17200 Nicotine dependence, unspecified, uncomplicated: Secondary | ICD-10-CM | POA: Insufficient documentation

## 2018-06-29 DIAGNOSIS — Z91010 Allergy to peanuts: Secondary | ICD-10-CM | POA: Insufficient documentation

## 2018-06-29 DIAGNOSIS — R300 Dysuria: Secondary | ICD-10-CM | POA: Insufficient documentation

## 2018-06-29 LAB — URINALYSIS, ROUTINE W REFLEX MICROSCOPIC
Bilirubin Urine: NEGATIVE
Glucose, UA: NEGATIVE mg/dL
Hgb urine dipstick: NEGATIVE
Ketones, ur: NEGATIVE mg/dL
Leukocytes,Ua: NEGATIVE
Nitrite: NEGATIVE
Protein, ur: NEGATIVE mg/dL
Specific Gravity, Urine: >1.03 — ABNORMAL HIGH (ref 1.005–1.030)
pH: 5.5 (ref 5.0–8.0)

## 2018-06-29 LAB — WET PREP, GENITAL
Clue Cells Wet Prep HPF POC: NONE SEEN
Sperm: NONE SEEN
Trich, Wet Prep: NONE SEEN
Yeast Wet Prep HPF POC: NONE SEEN

## 2018-06-29 MED ORDER — NITROFURANTOIN MONOHYD MACRO 100 MG PO CAPS: 100.0000 mg | ORAL_CAPSULE | Freq: Two times a day (BID) | ORAL | 0 refills | Status: DC

## 2018-06-29 MED ORDER — PHENAZOPYRIDINE HCL 200 MG PO TABS: 200.0000 mg | ORAL_TABLET | Freq: Three times a day (TID) | ORAL | 0 refills | Status: DC | PRN

## 2018-06-29 MED FILL — NITROFURANTOIN MONO-MCR 100: 100 | 5 days supply | Qty: 10 | Fill #0

## 2018-06-29 NOTE — ED Provider Notes (Signed)
[facility] EMERGENCY DEPARTMENT Provider Note   CSN: [id number] Arrival date & time: 06/29/18  1036    History   Chief Complaint Chief Complaint  Patient presents with  . Urinary Frequency    HPI Cindy Bailey is a 48 y.o. female.     HPI Patient reports he has some burning urgency with urination for about 2 days.  No low back pain, no fever, no nausea or vomiting.  No associated abdominal pain.  Patient is concerned for urinary tract infection.  She reports she has had a number of urinary tract infections and did not want to let this go too long.  She denies vaginal discharge.  She reports she does have history of frequent bacterial vaginosis.  He is sexually active.  Does not have high suspicion for STD exposure. Past Medical History:  Diagnosis Date  . Anxiety   . Asthma   . Bacterial vaginosis   . PID (acute pelvic inflammatory disease)     There are no active problems to display for this patient.   Past Surgical History:  Procedure Laterality Date  . WISDOM TOOTH EXTRACTION       OB History    Gravida  0   Para  0   Term  0   Preterm  0   AB  0   Living  0     SAB  0   TAB  0   Ectopic  0   Multiple  0   Live Births  0            Home Medications    Prior to Admission medications   Medication Sig Start Date End Date Taking? Authorizing Provider  azithromycin (ZITHROMAX) 250 MG tablet Take 1 tablet (250 mg total) by mouth daily. Take first 2 tablets together, then 1 every day until finished. 06/01/18   Carlyle BasquesHernandez, Ana P, PA-C  buPROPion (WELLBUTRIN XL) 150 MG 24 hr tablet Take 150 mg by mouth daily.    [provider]  busPIRone (BUSPAR) 15 MG tablet Take 15 mg 3 (three) times daily by mouth.    [provider]  ibuprofen (ADVIL,MOTRIN) 200 MG tablet Take 200 mg by mouth every 6 (six) hours as needed.    [provider]  Multiple Vitamins-Minerals (MULTIVITAMIN ADULT PO) Take by mouth.    [provider]  nitrofurantoin, macrocrystal-monohydrate, (MACROBID) 100 MG capsule Take 1 capsule (100 mg total) by mouth 2 (two) times daily. X 7 days 06/29/18   Arby BarrettePfeiffer, Keerthi [place], MD  phenazopyridine (PYRIDIUM) 200 MG tablet Take 1 tablet (200 mg total) by mouth 3 (three) times daily as needed for pain. 06/29/18   Arby BarrettePfeiffer, Tieara Flitton, MD  Probiotic Product (SOLUBLE FIBER/PROBIOTICS PO) Take by mouth.    [provider]    Family History History reviewed. No pertinent family history.  Social History Social History   Tobacco Use  . Smoking status: Current Every Day Smoker    Packs/day: 1.00    Types: Cigarettes, E-cigarettes  . Smokeless tobacco: Never Used  . Tobacco comment: Mostly vaping  Substance Use Topics  . Alcohol use: Yes    Comment: occ  . Drug use: Yes    Types: Marijuana     Allergies   Peanut-containing drug products and Penicillins   Review of Systems Review of Systems 10 Systems reviewed and are negative for acute change except as noted in the HPI.   Physical Exam Updated Vital Signs BP 127/72 (BP Location:  Right Arm)   Pulse 75   Temp 97.8 F (36.6 C) (Oral)   Resp 14   Ht 5\' 7"  (1.702 m)   Wt 70.8 kg   SpO2 98%   BMI 24.43 kg/m   Physical Exam Constitutional:      Appearance: Normal appearance.  Pulmonary:     Effort: Pulmonary effort is normal.  Abdominal:     General: There is no distension.     Palpations: Abdomen is soft.     Tenderness: There is no abdominal tenderness. There is no guarding.     Comments: No CVA tenderness.  Genitourinary:    Comments: No external female genitalia.  Speculum exam, cervix is normal.  No discharge.  No erythema.  No blood or pooling secretions in the vault.  No cervical motion tenderness.  Uterus and adnexa nontender. Musculoskeletal: Normal range of motion.        General: No swelling or tenderness.  Skin:    General: Skin is warm and dry.  Neurological:     General: No focal deficit present.      Mental Status: She is alert and oriented to person, place, and time.     Coordination: Coordination normal.  Psychiatric:        Mood and Affect: Mood normal.      ED Treatments / Results  Labs (all labs ordered are listed, but only abnormal results are displayed) Labs Reviewed  WET PREP, GENITAL - Abnormal; Notable for the following components:      Result Value   WBC, Wet Prep HPF POC FEW (*)    All other components within normal limits  URINALYSIS, ROUTINE W REFLEX MICROSCOPIC - Abnormal; Notable for the following components:   Specific Gravity, Urine >1.030 (*)    All other components within normal limits  URINE CULTURE  GC/CHLAMYDIA PROBE AMP ([place]) NOT AT [facility]    EKG None  Radiology No results found.  Procedures Procedures (including critical care time)  Medications Ordered in ED Medications - No data to display   Initial Impression / Assessment and Plan / ED Course  I have reviewed the triage vital signs and the nursing notes.  Pertinent labs & imaging results that were available during my care of the patient were reviewed by me and considered in my medical decision making (see chart for details).       Patient has dysuria and frequency with history of frequent UTI.  At this time otherwise clinically well.  Normal pelvic exam. No Abdominal pain or flank pain.  Given Macrobid based on symptomology.  Urine culture pending.  Follow-up plan and return precautions reviewed.  Final Clinical Impressions(s) / ED Diagnoses   Final diagnoses:  Dysuria    ED Discharge Orders         Ordered    nitrofurantoin, macrocrystal-monohydrate, (MACROBID) 100 MG capsule  2 times daily     06/29/18 1236    phenazopyridine (PYRIDIUM) 200 MG tablet  3 times daily PRN     06/29/18 1236           Arby Barrette, MD 06/29/18 1242

## 2018-06-29 NOTE — ED Triage Notes (Signed)
Pt states burning with urination for past few days.  Denies vaginal discharge.  Has had multiple uti's over past few years

## 2018-06-29 NOTE — Discharge Instructions (Signed)
1.  At this time your urine specimen is not showing significant signs of infection.  Urine culture has been obtained.  Your urine culture should show results with in 1 to 2 days.  Follow-up on the results in your MyChart.  You can wait to start antibiotics until your culture is returned.  You may take Pyridium for burning and urgency sensation. 2.  Call your family doctor to schedule a recheck. 3.  Return to the emergency department if he developed lower back pain, fever, vomiting or any worsening symptoms.

## 2018-06-29 NOTE — ED Notes (Signed)
Urine and culture at bedside  

## 2018-06-30 LAB — GC/CHLAMYDIA PROBE AMP (~~LOC~~) NOT AT ARMC
Chlamydia: NEGATIVE
Neisseria Gonorrhea: NEGATIVE

## 2018-06-30 LAB — URINE CULTURE: Culture: NO GROWTH

## 2018-09-10 ENCOUNTER — Other Ambulatory Visit: Payer: Self-pay

## 2018-09-10 ENCOUNTER — Emergency Department (HOSPITAL_BASED_OUTPATIENT_CLINIC_OR_DEPARTMENT_OTHER)
Admission: EM | Admit: 2018-09-10 | Discharge: 2018-09-10 | Disposition: A | Discharge status: Home / Self Care | Payer: Self-pay | Attending: Emergency Medicine | Admitting: Emergency Medicine

## 2018-09-10 ENCOUNTER — Encounter (HOSPITAL_BASED_OUTPATIENT_CLINIC_OR_DEPARTMENT_OTHER): Payer: Self-pay

## 2018-09-10 DIAGNOSIS — Z79899 Other long term (current) drug therapy: Secondary | ICD-10-CM | POA: Insufficient documentation

## 2018-09-10 DIAGNOSIS — Z91010 Allergy to peanuts: Secondary | ICD-10-CM | POA: Insufficient documentation

## 2018-09-10 DIAGNOSIS — J020 Streptococcal pharyngitis: Secondary | ICD-10-CM | POA: Insufficient documentation

## 2018-09-10 DIAGNOSIS — F17210 Nicotine dependence, cigarettes, uncomplicated: Secondary | ICD-10-CM | POA: Insufficient documentation

## 2018-09-10 DIAGNOSIS — J45909 Unspecified asthma, uncomplicated: Secondary | ICD-10-CM | POA: Insufficient documentation

## 2018-09-10 LAB — GROUP A STREP BY PCR: Group A Strep by PCR: DETECTED — AB

## 2018-09-10 MED ORDER — AZITHROMYCIN 250 MG PO TABS: 250.0000 mg | ORAL_TABLET | Freq: Every day | ORAL | 0 refills | Status: DC

## 2018-09-10 NOTE — ED Notes (Signed)
ED Provider at bedside. 

## 2018-09-10 NOTE — ED Provider Notes (Signed)
[facility] EMERGENCY DEPARTMENT Provider Note   CSN: [id number] Arrival date & time: 09/10/18  1905    History   Chief Complaint Chief Complaint  Patient presents with  . Sore Throat    HPI Cindy Bailey is a 48 y.o. female.     HPI Patient presents to the ED for evaluation of a sore throat.  Patient states the symptoms started yesterday.  She started having discomfort in the back of her throat.  She feels it up into her ears.  She also feels like her glands are swollen.  Patient denies any trouble with cough.  No shortness of breath.  No body aches.  No known fevers.  Difficulty swallowing or breathing. Past Medical History:  Diagnosis Date  . Anxiety   . Asthma   . Bacterial vaginosis   . PID (acute pelvic inflammatory disease)     There are no active problems to display for this patient.   Past Surgical History:  Procedure Laterality Date  . WISDOM TOOTH EXTRACTION       OB History    Gravida  0   Para  0   Term  0   Preterm  0   AB  0   Living  0     SAB  0   TAB  0   Ectopic  0   Multiple  0   Live Births  0            Home Medications    Prior to Admission medications   Medication Sig Start Date End Date Taking? Authorizing Provider  buPROPion (WELLBUTRIN XL) 150 MG 24 hr tablet Take 150 mg by mouth daily.   Yes [provider]  busPIRone (BUSPAR) 15 MG tablet Take 15 mg 3 (three) times daily by mouth.   Yes [provider]  ibuprofen (ADVIL,MOTRIN) 200 MG tablet Take 200 mg by mouth every 6 (six) hours as needed.   Yes [provider]  Multiple Vitamins-Minerals (MULTIVITAMIN ADULT PO) Take by mouth.   Yes [provider]  Probiotic Product (SOLUBLE FIBER/PROBIOTICS PO) Take by mouth.   Yes [provider]  azithromycin (ZITHROMAX) 250 MG tablet Take 1 tablet (250 mg total) by mouth daily. Take first 2 tablets together, then 1 every day until finished. 09/10/18   Linwood DibblesKnapp, Brilee Port, MD   nitrofurantoin, macrocrystal-monohydrate, (MACROBID) 100 MG capsule Take 1 capsule (100 mg total) by mouth 2 (two) times daily. X 7 days 06/29/18   Arby BarrettePfeiffer, Marcy, MD  phenazopyridine (PYRIDIUM) 200 MG tablet Take 1 tablet (200 mg total) by mouth 3 (three) times daily as needed for pain. 06/29/18   Arby BarrettePfeiffer, Marcy, MD    Family History No family history on file.  Social History Social History   Tobacco Use  . Smoking status: Current Every Day Smoker    Packs/day: 1.00    Types: Cigarettes, E-cigarettes  . Smokeless tobacco: Never Used  . Tobacco comment: Mostly vaping  Substance Use Topics  . Alcohol use: Yes    Comment: occ  . Drug use: Yes    Types: Marijuana     Allergies   Peanut-containing drug products and Penicillins   Review of Systems Review of Systems  All other systems reviewed and are negative.    Physical Exam Updated Vital Signs BP (!) 143/83 (BP Location: Left Arm)   Pulse 76   Temp 99.2 F (37.3 C) (Oral)   Resp 20   Ht 1.727 m (5\' 8" )  Wt 70.3 kg   SpO2 98%   BMI 23.57 kg/m   Physical Exam Vitals signs and nursing note reviewed.  Constitutional:      General: She is not in acute distress.    Appearance: She is well-developed.  HENT:     Head: Normocephalic and atraumatic.     Right Ear: Tympanic membrane and external ear normal.     Left Ear: Tympanic membrane and external ear normal.     Mouth/Throat:     Pharynx: Posterior oropharyngeal erythema present. No pharyngeal swelling, oropharyngeal exudate or uvula swelling.     Tonsils: No tonsillar exudate.  Eyes:     General: No scleral icterus.       Right eye: No discharge.        Left eye: No discharge.     Conjunctiva/sclera: Conjunctivae normal.  Neck:     Musculoskeletal: Neck supple.     Trachea: No tracheal deviation.  Cardiovascular:     Rate and Rhythm: Normal rate.  Pulmonary:     Effort: Pulmonary effort is normal. No respiratory distress.     Breath sounds: No stridor.   Abdominal:     General: There is no distension.  Musculoskeletal:        General: No swelling or deformity.  Lymphadenopathy:     Cervical: Cervical adenopathy present.  Skin:    General: Skin is warm and dry.     Findings: No rash.  Neurological:     Mental Status: She is alert.     Cranial Nerves: Cranial nerve deficit: no gross deficits.      ED Treatments / Results  Labs (all labs ordered are listed, but only abnormal results are displayed) Labs Reviewed  GROUP A STREP BY PCR - Abnormal; Notable for the following components:      Result Value   Group A Strep by PCR DETECTED (*)    All other components within normal limits     Procedures Procedures (including critical care time)  Medications Ordered in ED Medications - No data to display   Initial Impression / Assessment and Plan / ED Course  I have reviewed the triage vital signs and the nursing notes.  Pertinent labs & imaging results that were available during my care of the patient were reviewed by me and considered in my medical decision making (see chart for details).   Patient presented to the emergency room with complaints of a sore throat that she felt was similar to previous strep throat infections.  Her strep test was positive as she suspected.  Patient is allergic to penicillin.  Plan on discharge home with prescription for azithromycin.  Final Clinical Impressions(s) / ED Diagnoses   Final diagnoses:  Strep pharyngitis    ED Discharge Orders         Ordered    azithromycin (ZITHROMAX) 250 MG tablet  Daily     09/10/18 Smiley Houseman, MD 09/10/18 2006

## 2018-09-10 NOTE — Discharge Instructions (Addendum)
Take the antibiotics as prescribed, take over-the-counter medications to help with your sore throat, his review the discharge instructions for additional information

## 2018-09-10 NOTE — ED Triage Notes (Signed)
Pt states "I think I have strep throat." Pt repots sore throat that started last PM. Denies fever. Pt denies sick contacts.

## 2019-01-12 ENCOUNTER — Other Ambulatory Visit: Payer: Self-pay

## 2019-01-12 ENCOUNTER — Encounter (HOSPITAL_BASED_OUTPATIENT_CLINIC_OR_DEPARTMENT_OTHER): Payer: Self-pay | Admitting: *Deleted

## 2019-01-12 ENCOUNTER — Emergency Department (HOSPITAL_BASED_OUTPATIENT_CLINIC_OR_DEPARTMENT_OTHER)
Admission: EM | Admit: 2019-01-12 | Discharge: 2019-01-12 | Disposition: A | Discharge status: Home / Self Care | Payer: Self-pay | Attending: Emergency Medicine | Admitting: Emergency Medicine

## 2019-01-12 DIAGNOSIS — R102 Pelvic and perineal pain: Secondary | ICD-10-CM | POA: Insufficient documentation

## 2019-01-12 DIAGNOSIS — Z79899 Other long term (current) drug therapy: Secondary | ICD-10-CM | POA: Insufficient documentation

## 2019-01-12 DIAGNOSIS — Z91010 Allergy to peanuts: Secondary | ICD-10-CM | POA: Insufficient documentation

## 2019-01-12 DIAGNOSIS — Z881 Allergy status to other antibiotic agents status: Secondary | ICD-10-CM | POA: Insufficient documentation

## 2019-01-12 DIAGNOSIS — R300 Dysuria: Secondary | ICD-10-CM | POA: Insufficient documentation

## 2019-01-12 DIAGNOSIS — J45909 Unspecified asthma, uncomplicated: Secondary | ICD-10-CM | POA: Insufficient documentation

## 2019-01-12 DIAGNOSIS — F17210 Nicotine dependence, cigarettes, uncomplicated: Secondary | ICD-10-CM | POA: Insufficient documentation

## 2019-01-12 DIAGNOSIS — Z880 Allergy status to penicillin: Secondary | ICD-10-CM | POA: Insufficient documentation

## 2019-01-12 DIAGNOSIS — R350 Frequency of micturition: Secondary | ICD-10-CM | POA: Insufficient documentation

## 2019-01-12 MED ORDER — SULFAMETHOXAZOLE-TRIMETHOPRIM 800-160 MG PO TABS: 1.0000 | ORAL_TABLET | Freq: Two times a day (BID) | ORAL | 0 refills | Status: AC

## 2019-01-12 MED FILL — SULFAMETHOXAZOLE-TMP DS TAB: 800-160 | 7 days supply | Qty: 14 | Fill #0

## 2019-01-12 NOTE — ED Triage Notes (Signed)
Pt reports dysuria, freqency, urgency, her usual uti sx. "I know exactly what this is." denies fevers or any other c/o.

## 2019-01-12 NOTE — ED Provider Notes (Signed)
[facility] EMERGENCY DEPARTMENT Provider Note   CSN: [id number] Arrival date & time: 01/12/19  1018     History   Chief Complaint Chief Complaint  Patient presents with  . Dysuria    HPI Cindy Bailey is a 48 y.o. female with a history of recurrent urinary tract infections who presents emergency department with chief complaint of dysuria.  She states that she has had 2 days of frequency, urgency to urinate and burning with urination exactly like her previous UTI symptoms.  She denies any vaginal discharge or vaginal symptoms. She has mild suprapubic pain but denies flank pain, fevers, nausea or vomiting.  She has been using ibuprofen and Azo-Standard.  I reviewed the patient's EMR specifically her urinary sensitivity and culture reports which show recurrent E. coli infection which has been pansensitive.  Patient denies a history of pyelonephritis.     HPI  Past Medical History:  Diagnosis Date  . Anxiety   . Asthma   . Bacterial vaginosis   . PID (acute pelvic inflammatory disease)     There are no active problems to display for this patient.   Past Surgical History:  Procedure Laterality Date  . WISDOM TOOTH EXTRACTION       OB History    Gravida  0   Para  0   Term  0   Preterm  0   AB  0   Living  0     SAB  0   TAB  0   Ectopic  0   Multiple  0   Live Births  0            Home Medications    Prior to Admission medications   Medication Sig Start Date End Date Taking? Authorizing Provider  azithromycin (ZITHROMAX) 250 MG tablet Take 1 tablet (250 mg total) by mouth daily. Take first 2 tablets together, then 1 every day until finished. 09/10/18   Linwood DibblesKnapp, Jon, MD  buPROPion (WELLBUTRIN XL) 150 MG 24 hr tablet Take 150 mg by mouth daily.    [provider]  busPIRone (BUSPAR) 15 MG tablet Take 15 mg 3 (three) times daily by mouth.    [provider]  ibuprofen (ADVIL,MOTRIN) 200 MG tablet Take 200 mg by mouth  every 6 (six) hours as needed.    [provider]  Multiple Vitamins-Minerals (MULTIVITAMIN ADULT PO) Take by mouth.    [provider]  nitrofurantoin, macrocrystal-monohydrate, (MACROBID) 100 MG capsule Take 1 capsule (100 mg total) by mouth 2 (two) times daily. X 7 days 06/29/18   Arby BarrettePfeiffer, Marcy, MD  phenazopyridine (PYRIDIUM) 200 MG tablet Take 1 tablet (200 mg total) by mouth 3 (three) times daily as needed for pain. 06/29/18   Arby BarrettePfeiffer, Marcy, MD  Probiotic Product (SOLUBLE FIBER/PROBIOTICS PO) Take by mouth.    [provider]  sulfamethoxazole-trimethoprim (BACTRIM DS) 800-160 MG tablet Take 1 tablet by mouth 2 (two) times daily for 7 days. 01/12/19 01/19/19  Arthor CaptainHarris, Caliyah Sieh, PA-C    Family History History reviewed. No pertinent family history.  Social History Social History   Tobacco Use  . Smoking status: Current Every Day Smoker    Packs/day: 1.00    Types: Cigarettes, E-cigarettes  . Smokeless tobacco: Never Used  . Tobacco comment: Mostly vaping  Substance Use Topics  . Alcohol use: Yes    Comment: occ  . Drug use: Yes    Types: Marijuana     Allergies   Keflex [cephalexin],  Peanut-containing drug products, and Penicillins   Review of Systems Review of Systems  Ten systems reviewed and are negative for acute change, except as noted in the HPI.   Physical Exam Updated Vital Signs BP (!) 144/81 (BP Location: Right Arm)   Pulse 72   Temp 97.8 F (36.6 C) (Oral)   Resp 18   Ht 5\' 7"  (1.702 m)   Wt 70.3 kg   SpO2 100%   BMI 24.28 kg/m   Physical Exam Vitals signs and nursing note reviewed.  Constitutional:      General: She is not in acute distress.    Appearance: She is well-developed. She is not diaphoretic.  HENT:     Head: Normocephalic and atraumatic.  Eyes:     General: No scleral icterus.    Conjunctiva/sclera: Conjunctivae normal.  Neck:     Musculoskeletal: Normal range of motion.  Cardiovascular:     Rate and  Rhythm: Normal rate and regular rhythm.     Heart sounds: Normal heart sounds. No murmur. No friction rub. No gallop.   Pulmonary:     Effort: Pulmonary effort is normal. No respiratory distress.     Breath sounds: Normal breath sounds.  Abdominal:     General: Bowel sounds are normal. There is no distension.     Palpations: Abdomen is soft. There is no mass.     Tenderness: There is abdominal tenderness (Mild suprapubic). There is no right CVA tenderness, left CVA tenderness or guarding.  Skin:    General: Skin is warm and dry.  Neurological:     Mental Status: She is alert and oriented to person, place, and time.  Psychiatric:        Behavior: Behavior normal.      ED Treatments / Results  Labs (all labs ordered are listed, but only abnormal results are displayed) Labs Reviewed - No data to display  EKG None  Radiology No results found.  Procedures Procedures (including critical care time)  Medications Ordered in ED Medications - No data to display   Initial Impression / Assessment and Plan / ED Course  I have reviewed the triage vital signs and the nursing notes.  Pertinent labs & imaging results that were available during my care of the patient were reviewed by me and considered in my medical decision making (see chart for details).        48 year old female here with recurrent urinary tract infections she states "I know what this is."  I discussed waiting for urine results versus treating.  Patient states that she has no need to wait for her UA to result.  She feels very comfortable that this is a UTI.  She exhibits no evidence of systemic infection or pyelonephritis.  I discussed that since we are not getting a urinalysis there is the possibility that this could be a different bacteria than normal however she feels comfortable at this point going home and treating empirically.  She states that Bactrim has worked very well for her in the past.  I have explicitly  discussed return precautions with the patient to include worsening pain, flank pain, fever, nausea, vomiting, vaginal symptoms.  Patient expresses her understanding using the teach back method and appears appropriate for discharge at this time.  Final Clinical Impressions(s) / ED Diagnoses   Final diagnoses:  Dysuria    ED Discharge Orders         Ordered    sulfamethoxazole-trimethoprim (BACTRIM DS) 800-160 MG tablet  2  times daily     01/12/19 1114           Arthor Captain, [place] 01/12/19 1121    Little, Ambrose Finland, MD 01/12/19 1213

## 2019-01-12 NOTE — ED Notes (Signed)
C/o increased urinary freq, burning w urination x 2-3 days

## 2019-01-12 NOTE — Discharge Instructions (Addendum)
Contact a health care provider if:  You have a fever.  You develop pain in your back or sides.  You have nausea or vomiting.  You have blood in your urine.  You are not urinating as often as you usually do.  Get help right away if:  Your pain is severe and not relieved with medicines.  You cannot eat or drink without vomiting.  You are confused.  You have a rapid heartbeat while at rest.  You have shaking or chills.  You feel extremely weak.

## 2019-11-08 ENCOUNTER — Emergency Department (HOSPITAL_BASED_OUTPATIENT_CLINIC_OR_DEPARTMENT_OTHER)
Admission: EM | Admit: 2019-11-08 | Discharge: 2019-11-08 | Disposition: A | Discharge status: Home / Self Care | Payer: Self-pay | Attending: Emergency Medicine | Admitting: Emergency Medicine

## 2019-11-08 ENCOUNTER — Other Ambulatory Visit: Payer: Self-pay

## 2019-11-08 ENCOUNTER — Encounter (HOSPITAL_BASED_OUTPATIENT_CLINIC_OR_DEPARTMENT_OTHER): Payer: Self-pay | Admitting: Emergency Medicine

## 2019-11-08 DIAGNOSIS — Z91010 Allergy to peanuts: Secondary | ICD-10-CM | POA: Insufficient documentation

## 2019-11-08 DIAGNOSIS — F17210 Nicotine dependence, cigarettes, uncomplicated: Secondary | ICD-10-CM | POA: Insufficient documentation

## 2019-11-08 DIAGNOSIS — N390 Urinary tract infection, site not specified: Secondary | ICD-10-CM | POA: Insufficient documentation

## 2019-11-08 DIAGNOSIS — J45909 Unspecified asthma, uncomplicated: Secondary | ICD-10-CM | POA: Insufficient documentation

## 2019-11-08 LAB — URINALYSIS, ROUTINE W REFLEX MICROSCOPIC
Bilirubin Urine: NEGATIVE
Glucose, UA: 100 mg/dL — AB
Hgb urine dipstick: NEGATIVE
Ketones, ur: NEGATIVE mg/dL
Nitrite: POSITIVE — AB
Protein, ur: 30 mg/dL — AB
Specific Gravity, Urine: >1.03 — ABNORMAL HIGH (ref 1.005–1.030)
pH: 5 (ref 5.0–8.0)

## 2019-11-08 LAB — URINALYSIS, MICROSCOPIC (REFLEX)

## 2019-11-08 MED ORDER — NITROFURANTOIN MONOHYD MACRO 100 MG PO CAPS: 100.0000 mg | ORAL_CAPSULE | Freq: Two times a day (BID) | ORAL | 0 refills | Status: DC

## 2019-11-08 MED FILL — NITROFURANTOIN MONO-MCR 100: 100 | 5 days supply | Qty: 10 | Fill #0

## 2019-11-08 NOTE — ED Triage Notes (Signed)
Pain with urination for a couple days and low abd pain. Hx of UTI.

## 2019-11-09 LAB — URINE CULTURE: Culture: NO GROWTH

## 2019-11-14 NOTE — ED Provider Notes (Signed)
[facility] EMERGENCY DEPARTMENT Provider Note   CSN: [id number] Arrival date & time: 11/08/19  0818     History Chief Complaint  Patient presents with  . Dysuria    Cindy Bailey is a 49 y.o. female.  HPI   49 year old female with dysuria.  Onset a couple days ago.  Pain in the lower abdomen.  Does not lateralize.  She has a history of urinary tract infection is concerned she has another one.  No unusual vaginal bleeding or discharge.  No fevers or chills.  No back pain.  Past Medical History:  Diagnosis Date  . Anxiety   . Asthma   . Bacterial vaginosis   . PID (acute pelvic inflammatory disease)     There are no problems to display for this patient.   Past Surgical History:  Procedure Laterality Date  . WISDOM TOOTH EXTRACTION       OB History    Gravida  0   Para  0   Term  0   Preterm  0   AB  0   Living  0     SAB  0   TAB  0   Ectopic  0   Multiple  0   Live Births  0           No family history on file.  Social History   Tobacco Use  . Smoking status: Current Every Day Smoker    Packs/day: 0.50    Types: Cigarettes, E-cigarettes  . Smokeless tobacco: Never Used  . Tobacco comment: Mostly vaping  Vaping Use  . Vaping Use: Former  Substance Use Topics  . Alcohol use: Yes    Comment: occ  . Drug use: Yes    Types: Marijuana    Home Medications Prior to Admission medications   Medication Sig Start Date End Date Taking? Authorizing Provider  azithromycin (ZITHROMAX) 250 MG tablet Take 1 tablet (250 mg total) by mouth daily. Take first 2 tablets together, then 1 every day until finished. 09/10/18   Linwood Dibbles, MD  buPROPion (WELLBUTRIN XL) 150 MG 24 hr tablet Take 150 mg by mouth daily.    [provider]  busPIRone (BUSPAR) 15 MG tablet Take 15 mg 3 (three) times daily by mouth.    [provider]  ibuprofen (ADVIL,MOTRIN) 200 MG tablet Take 200 mg by mouth every 6 (six) hours as needed.     [provider]  Multiple Vitamins-Minerals (MULTIVITAMIN ADULT PO) Take by mouth.    [provider]  nitrofurantoin, macrocrystal-monohydrate, (MACROBID) 100 MG capsule Take 1 capsule (100 mg total) by mouth 2 (two) times daily. 11/08/19   Raeford Razor, MD  phenazopyridine (PYRIDIUM) 200 MG tablet Take 1 tablet (200 mg total) by mouth 3 (three) times daily as needed for pain. 06/29/18   Arby Barrette, MD  Probiotic Product (SOLUBLE FIBER/PROBIOTICS PO) Take by mouth.    [provider]    Allergies    Amoxicillin, Keflex [cephalexin], Peanut-containing drug products, and Penicillins  Review of Systems   Review of Systems All systems reviewed and negative, other than as noted in HPI.  Physical Exam Updated Vital Signs BP 106/82 (BP Location: Left Arm)   Pulse (!) 56   Temp 98.8 F (37.1 C) (Oral)   Resp 16   Ht 5\' 8"  (1.727 m)   Wt 71.4 kg   LMP 05/10/2018 (Approximate)   SpO2 96%   BMI 23.95 kg/m   Physical Exam Vitals  and nursing note reviewed.  Constitutional:      General: She is not in acute distress.    Appearance: She is well-developed.  HENT:     Head: Normocephalic and atraumatic.  Eyes:     General:        Right eye: No discharge.        Left eye: No discharge.     Conjunctiva/sclera: Conjunctivae normal.  Cardiovascular:     Rate and Rhythm: Normal rate and regular rhythm.     Heart sounds: Normal heart sounds. No murmur heard.  No friction rub. No gallop.   Pulmonary:     Effort: Pulmonary effort is normal. No respiratory distress.     Breath sounds: Normal breath sounds.  Abdominal:     General: There is no distension.     Palpations: Abdomen is soft.     Tenderness: There is no abdominal tenderness.  Genitourinary:    Comments: No abdominal tenderness.  No CVA tenderness. Musculoskeletal:        General: No tenderness.     Cervical back: Neck supple.  Skin:    General: Skin is warm and dry.  Neurological:     Mental  Status: She is alert.  Psychiatric:        Behavior: Behavior normal.        Thought Content: Thought content normal.     ED Results / Procedures / Treatments   Labs (all labs ordered are listed, but only abnormal results are displayed) Labs Reviewed  URINALYSIS, ROUTINE W REFLEX MICROSCOPIC - Abnormal; Notable for the following components:      Result Value   Color, Urine ORANGE (*)    Specific Gravity, Urine >1.030 (*)    Glucose, UA 100 (*)    Protein, ur 30 (*)    Nitrite POSITIVE (*)    Leukocytes,Ua TRACE (*)    All other components within normal limits  URINALYSIS, MICROSCOPIC (REFLEX) - Abnormal; Notable for the following components:   Bacteria, UA MANY (*)    All other components within normal limits  URINE CULTURE    EKG None  Radiology No results found.  Procedures Procedures (including critical care time)  Medications Ordered in ED Medications - No data to display  ED Course  I have reviewed the triage vital signs and the nursing notes.  Pertinent labs & imaging results that were available during my care of the patient were reviewed by me and considered in my medical decision making (see chart for details).    MDM Rules/Calculators/A&P                          49 year old female with symptoms consistent with UTI.  Febrile.  Abdominal exam benign.  No CVA tenderness.  UA suggestive.  Antibiotics.  Return precautions discussed.  Final Clinical Impression(s) / ED Diagnoses Final diagnoses:  Lower urinary tract infectious disease    Rx / DC Orders ED Discharge Orders         Ordered    nitrofurantoin, macrocrystal-monohydrate, (MACROBID) 100 MG capsule  2 times daily        11/08/19 1027           Raeford Razor, MD 11/14/19 [phone]

## 2021-08-23 ENCOUNTER — Ambulatory Visit
Admission: EM | Admit: 2021-08-23 | Discharge: 2021-08-23 | Disposition: A | Discharge status: Home / Self Care | Payer: BLUE CROSS/BLUE SHIELD

## 2021-08-23 DIAGNOSIS — L0100 Impetigo, unspecified: Secondary | ICD-10-CM | POA: Diagnosis not present

## 2021-08-23 MED ORDER — CEFDINIR 300 MG PO CAPS: 300.0000 mg | ORAL_CAPSULE | Freq: Two times a day (BID) | ORAL | 0 refills | Status: AC

## 2021-08-23 MED ORDER — DOXYCYCLINE HYCLATE 100 MG PO CAPS: 100.0000 mg | ORAL_CAPSULE | Freq: Two times a day (BID) | ORAL | 0 refills | Status: DC

## 2021-08-23 MED ORDER — MUPIROCIN 2 % EX OINT: TOPICAL_OINTMENT | CUTANEOUS | 0 refills | Status: DC

## 2021-08-23 NOTE — ED Provider Notes (Signed)
[facility]    CSN: [id number] Arrival date & time: 08/23/21  1453    HISTORY   Chief Complaint  Patient presents with   Abrasion   HPI Cindy Bailey is a 51 y.o. female. Patient complains of a rash on the right side of her chin and similar rash beginning to appear that is her right eye.  Patient states a friend of hers who happens to be a nurse was attempting to use a needle to extract milia from her skin and patient believes that she got an infection from this manipulation.  Patient states she has been putting hydrocortisone cream on her skin but that seems to be making it worse.  The history is provided by the patient.  Past Medical History:  Diagnosis Date   Anxiety    Asthma    Bacterial vaginosis    PID (acute pelvic inflammatory disease)    There are no problems to display for this patient.  Past Surgical History:  Procedure Laterality Date   WISDOM TOOTH EXTRACTION     OB History     Gravida  0   Para  0   Term  0   Preterm  0   AB  0   Living  0      SAB  0   IAB  0   Ectopic  0   Multiple  0   Live Births  0          Home Medications    Prior to Admission medications   Medication Sig Start Date End Date Taking? Authorizing Provider  albuterol (VENTOLIN HFA) 108 (90 Base) MCG/ACT inhaler Inhale into the lungs. 04/10/21  Yes [provider]  methylphenidate (RITALIN) 5 MG tablet Take by mouth. 06/13/21  Yes [provider]  traZODone (DESYREL) 100 MG tablet Take by mouth. 06/13/21  Yes [provider]  azithromycin (ZITHROMAX) 250 MG tablet Take 1 tablet (250 mg total) by mouth daily. Take first 2 tablets together, then 1 every day until finished. 09/10/18   Linwood Dibbles, MD  buPROPion (WELLBUTRIN XL) 150 MG 24 hr tablet Take 150 mg by mouth daily.    [provider]  busPIRone (BUSPAR) 15 MG tablet Take 15 mg 3 (three) times daily by mouth.    [provider]  ibuprofen (ADVIL,MOTRIN)  200 MG tablet Take 200 mg by mouth every 6 (six) hours as needed.    [provider]  Multiple Vitamins-Minerals (MULTIVITAMIN ADULT PO) Take by mouth.    [provider]  nitrofurantoin, macrocrystal-monohydrate, (MACROBID) 100 MG capsule Take 1 capsule (100 mg total) by mouth 2 (two) times daily. 11/08/19   Raeford Razor, MD  phenazopyridine (PYRIDIUM) 200 MG tablet Take 1 tablet (200 mg total) by mouth 3 (three) times daily as needed for pain. 06/29/18   Arby Barrette, MD  Probiotic Product (SOLUBLE FIBER/PROBIOTICS PO) Take by mouth.    [provider]    Family History History reviewed. No pertinent family history. Social History Social History   Tobacco Use   Smoking status: Every Day    Packs/day: 0.50    Types: Cigarettes, E-cigarettes   Smokeless tobacco: Never   Tobacco comments:    Mostly vaping  Vaping Use   Vaping Use: Former  Substance Use Topics   Alcohol use: Yes    Comment: occ   Drug use: Not Currently    Types: Marijuana   Allergies   Peanut-containing drug products, Amoxicillin, Keflex [cephalexin],  and Penicillins  Review of Systems Review of Systems Pertinent findings noted in history of present illness.   Physical Exam Triage Vital Signs ED Triage Vitals  Enc Vitals Group     BP 01/17/21 0827 (!) 147/82     Pulse Rate 01/17/21 0827 72     Resp 01/17/21 0827 18     Temp 01/17/21 0827 98.3 F (36.8 C)     Temp Source 01/17/21 0827 Oral     SpO2 01/17/21 0827 98 %     Weight --      Height --      Head Circumference --      Peak Flow --      Pain Score 01/17/21 0826 5     Pain Loc --      Pain Edu? --      Excl. in GC? --   No data found.  Updated Vital Signs BP (!) 113/55 (BP Location: Left Arm)   Pulse 66   Temp 98 F (36.7 C) (Oral)   Resp 18   LMP 05/10/2018 (Approximate)   SpO2 96%   Physical Exam Vitals and nursing note reviewed.  Constitutional:      General: She is awake. She is not in acute  distress.    Appearance: Normal appearance. She is well-developed, well-groomed and normal weight.  Skin:    Comments: TNTC honey crusted lesions right side of chin and beneath right lower eyelid.  Neurological:     Mental Status: She is alert.  Psychiatric:        Behavior: Behavior is cooperative.    Visual Acuity Right Eye Distance:   Left Eye Distance:   Bilateral Distance:    Right Eye Near:   Left Eye Near:    Bilateral Near:     UC Couse / Diagnostics / Procedures:    EKG  Radiology No results found.  Procedures Procedures (including critical care time)  UC Diagnoses / Final Clinical Impressions(s)   I have reviewed the triage vital signs and the nursing notes.  Pertinent labs & imaging results that were available during my care of the patient were reviewed by me and considered in my medical decision making (see chart for details).    Final diagnoses:  Impetigo   Patient reports allergy to penicillin and amoxicillin, EMR reviewed, patient provided with prescription for doxycycline which she appears to have been prescribed several times in the past.  Patient also provided with a prescription for mupirocin that she can apply twice daily.  Return precautions advised.  ED Prescriptions     Medication Sig Dispense Auth. Provider   doxycycline (VIBRAMYCIN) 100 MG capsule Take 1 capsule (100 mg total) by mouth 2 (two) times daily for 7 days. 14 capsule Theadora Rama Scales, PA-C   mupirocin ointment (BACTROBAN) 2 % Apply to affected area twice daily for 7 days. 30 g Theadora Rama Scales, PA-C      PDMP not reviewed this encounter.  Pending results:  Labs Reviewed - No data to display  Medications Ordered in UC: Medications - No data to display  Disposition Upon Discharge:  Condition: stable for discharge home Home: take medications as prescribed; routine discharge instructions as discussed; follow up as advised.  Patient presented with an acute illness  with associated systemic symptoms and significant discomfort requiring urgent management. In my opinion, this is a condition that a prudent lay person (someone who possesses an average knowledge of health and medicine) may potentially expect to result  in complications if not addressed urgently such as respiratory distress, impairment of bodily function or dysfunction of bodily organs.   Routine symptom specific, illness specific and/or disease specific instructions were discussed with the patient and/or caregiver at length.   As such, the patient has been evaluated and assessed, work-up was performed and treatment was provided in alignment with urgent care protocols and evidence based medicine.  Patient/parent/caregiver has been advised that the patient may require follow up for further testing and treatment if the symptoms continue in spite of treatment, as clinically indicated and appropriate.  Patient/parent/caregiver has been advised to [facility] or [facility]CP if no better; to PCP or the Emergency Department if new signs and symptoms develop, or if the current signs or symptoms continue to change or worsen for further workup, evaluation and treatment as clinically indicated and appropriate  The patient will follow up with their current PCP if and as advised. If the patient does not currently have a PCP we will assist them in obtaining one.   The patient may need specialty follow up if the symptoms continue, in spite of conservative treatment and management, for further workup, evaluation, consultation and treatment as clinically indicated and appropriate.   Patient/parent/caregiver verbalized understanding and agreement of plan as discussed.  All questions were addressed during visit.  Please see discharge instructions below for further details of plan.  Discharge Instructions:   Discharge Instructions      After reviewing clinical guidelines, doxycycline is actually better option than the  cefdinir that we discussed.  I see that you have been on doxycycline in the past and from observation is generally very well-tolerated by most patients.  I sent a prescription to your pharmacy for 7-day course, please take 1 capsule twice daily for the next 7 days.  I also sent a prescription for a topical antibiotic called mupirocin that you can apply twice daily to your lesions to help prevent spread while you are waiting for the doxycycline to take full effect.  If you have not had complete relief of your infection in the next 7 days, please return for repeat evaluation or follow-up with your primary care provider.  Thank you for visiting urgent care today.      This office note has been dictated using Teaching laboratory technicianDragon speech recognition software.  Unfortunately, and despite my best efforts, this method of dictation can sometimes lead to occasional typographical or grammatical errors.  I apologize in advance if this occurs.     Theadora RamaMorgan, Abdoulie Tierce Scales, PA-C 08/23/21 1531

## 2021-08-23 NOTE — Discharge Instructions (Addendum)
Prescription for Omnicef to your pharmacy for 7-day course, please take 1 capsule twice daily for the next 7 days.  I also sent a prescription for a topical antibiotic called mupirocin that you can apply twice daily to your lesions to help prevent spread while you are waiting for the doxycycline to take full effect.  If you have not had complete relief of your infection in the next 7 days, please return for repeat evaluation or follow-up with your primary care provider.  Thank you for visiting urgent care today.

## 2021-08-23 NOTE — ED Triage Notes (Signed)
Pt c/o abrasion to the right side of her face. Started: Thursday

## 2021-08-23 NOTE — ED Notes (Signed)
Pt currently being checked in by patient access.  

## 2022-04-01 ENCOUNTER — Ambulatory Visit
Admission: RE | Admit: 2022-04-01 | Discharge: 2022-04-01 | Disposition: A | Discharge status: Home / Self Care | Payer: BLUE CROSS/BLUE SHIELD | Source: Ambulatory Visit | Attending: Urgent Care | Admitting: Urgent Care

## 2022-04-01 VITALS — BP 144/74 | HR 79 | Temp 99.4°F | Resp 16

## 2022-04-01 DIAGNOSIS — B9789 Other viral agents as the cause of diseases classified elsewhere: Secondary | ICD-10-CM

## 2022-04-01 DIAGNOSIS — J329 Chronic sinusitis, unspecified: Secondary | ICD-10-CM | POA: Diagnosis not present

## 2022-04-01 DIAGNOSIS — J209 Acute bronchitis, unspecified: Secondary | ICD-10-CM

## 2022-04-01 MED ORDER — PROMETHAZINE-DM 6.25-15 MG/5ML PO SYRP: 5.0000 mL | ORAL_SOLUTION | Freq: Three times a day (TID) | ORAL | 0 refills | Status: DC | PRN

## 2022-04-01 MED ORDER — PREDNISONE 50 MG PO TABS: 50.0000 mg | ORAL_TABLET | Freq: Every day | ORAL | 0 refills | Status: DC

## 2022-04-01 NOTE — ED Triage Notes (Signed)
Pt c/o cough, head/chest congestion, fever x 3 days-NAD-steady gait

## 2022-04-01 NOTE — ED Provider Notes (Addendum)
[facility]  Note:  This document was prepared using Systems analyst and may include unintentional dictation errors.  MRN: [id number] DOB: Jul 13, 1970  Subjective:   Cindy Bailey is a 52 y.o. female presenting for 3-day history of acute onset sinus congestion, chest congestion, throat pain, productive cough that elicits chest pain and shortness of breath, wheezing.  Has a history of asthma and has been using her inhaler very frequently.  Has cut back significantly on her smoking.  Was seen and tested for COVID, states that it was negative.  This was a rapid test as seen on care everywhere.  No current facility-administered medications for this encounter.  Current Outpatient Medications:    albuterol (VENTOLIN HFA) 108 (90 Base) MCG/ACT inhaler, Inhale into the lungs., Disp: , Rfl:    buPROPion (WELLBUTRIN XL) 150 MG 24 hr tablet, Take 150 mg by mouth daily., Disp: , Rfl:    ibuprofen (ADVIL,MOTRIN) 200 MG tablet, Take 200 mg by mouth every 6 (six) hours as needed., Disp: , Rfl:    methylphenidate (RITALIN) 5 MG tablet, Take by mouth., Disp: , Rfl:    Multiple Vitamins-Minerals (MULTIVITAMIN ADULT PO), Take by mouth., Disp: , Rfl:    mupirocin ointment (BACTROBAN) 2 %, Apply to affected area twice daily for 7 days., Disp: 30 g, Rfl: 0   Probiotic Product (SOLUBLE FIBER/PROBIOTICS PO), Take by mouth., Disp: , Rfl:    traZODone (DESYREL) 100 MG tablet, Take by mouth., Disp: , Rfl:    Allergies  Allergen Reactions   Peanut-Containing Drug Products Hives   Amoxicillin Rash   Keflex [Cephalexin] Other (See Comments)    Stomach upset   Penicillins Rash    Past Medical History:  Diagnosis Date   Anxiety    Asthma    Bacterial vaginosis    PID (acute pelvic inflammatory disease)      Past Surgical History:  Procedure Laterality Date   WISDOM TOOTH EXTRACTION      No family history on file.  Social History   Tobacco Use   Smoking  status: Every Day    Packs/day: 0.50    Types: Cigarettes   Smokeless tobacco: Never   Tobacco comments:    Mostly vaping  Vaping Use   Vaping Use: Former  Substance Use Topics   Alcohol use: Not Currently   Drug use: Not Currently    ROS   Objective:   Vitals: BP (!) 144/74 (BP Location: Right Arm)   Pulse 79   Temp 99.4 F (37.4 C) (Oral)   Resp 16   LMP 05/10/2018 (Approximate)   SpO2 92%   Pulse oximetry 96%-97% on recheck.  Physical Exam Constitutional:      General: She is not in acute distress.    Appearance: Normal appearance. She is well-developed and normal weight. She is not ill-appearing, toxic-appearing or diaphoretic.  HENT:     Head: Normocephalic and atraumatic.     Right Ear: Tympanic membrane, ear canal and external ear normal. No drainage or tenderness. No middle ear effusion. There is no impacted cerumen. Tympanic membrane is not erythematous or bulging.     Left Ear: Tympanic membrane, ear canal and external ear normal. No drainage or tenderness.  No middle ear effusion. There is no impacted cerumen. Tympanic membrane is not erythematous or bulging.     Nose: Congestion present. No rhinorrhea.     Mouth/Throat:     Mouth: Mucous membranes are moist. No oral lesions.  Pharynx: No pharyngeal swelling, oropharyngeal exudate, posterior oropharyngeal erythema or uvula swelling.     Tonsils: No tonsillar exudate or tonsillar abscesses.     Comments: Post-nasal drainage overlying pharynx.  Eyes:     General: No scleral icterus.       Right eye: No discharge.        Left eye: No discharge.     Extraocular Movements: Extraocular movements intact.     Right eye: Normal extraocular motion.     Left eye: Normal extraocular motion.     Conjunctiva/sclera: Conjunctivae normal.  Cardiovascular:     Rate and Rhythm: Normal rate and regular rhythm.     Heart sounds: Normal heart sounds. No murmur heard.    No friction rub. No gallop.  Pulmonary:      Effort: Pulmonary effort is normal. No respiratory distress.     Breath sounds: No stridor. Rhonchi (Mild over mid lung fields bilaterally) present. No wheezing or rales.  Chest:     Chest wall: No tenderness.  Musculoskeletal:     Cervical back: Normal range of motion and neck supple.  Lymphadenopathy:     Cervical: No cervical adenopathy.  Skin:    General: Skin is warm and dry.  Neurological:     General: No focal deficit present.     Mental Status: She is alert and oriented to person, place, and time.  Psychiatric:        Mood and Affect: Mood normal.        Behavior: Behavior normal.     Assessment and Plan :   PDMP not reviewed this encounter.  1. Acute bronchitis, unspecified organism   2. Viral sinusitis     In the context of her smoking, pulmonary exam recommended an oral prednisone course for management of her bronchitis.  Otherwise we will manage follow-up viral sinusitis with supportive care.  She declined any more testing, an albuterol inhaler refill.  Deferred imaging given clear cardiopulmonary exam, hemodynamically stable vital signs. Counseled patient on potential for adverse effects with medications prescribed/recommended today, ER and return-to-clinic precautions discussed, patient verbalized understanding.    Jaynee Eagles, PA-C 04/01/22 1311

## 2022-04-15 ENCOUNTER — Telehealth (HOSPITAL_COMMUNITY): Payer: Self-pay

## 2022-04-25 ENCOUNTER — Ambulatory Visit
Admission: RE | Admit: 2022-04-25 | Discharge: 2022-04-25 | Disposition: A | Discharge status: Home / Self Care | Payer: BLUE CROSS/BLUE SHIELD | Source: Ambulatory Visit

## 2022-04-25 VITALS — BP 115/81 | HR 72 | Temp 98.3°F | Resp 20

## 2022-04-25 DIAGNOSIS — J209 Acute bronchitis, unspecified: Secondary | ICD-10-CM

## 2022-04-25 DIAGNOSIS — F17200 Nicotine dependence, unspecified, uncomplicated: Secondary | ICD-10-CM

## 2022-04-25 DIAGNOSIS — R053 Chronic cough: Secondary | ICD-10-CM | POA: Diagnosis not present

## 2022-04-25 MED ORDER — PROMETHAZINE-DM 6.25-15 MG/5ML PO SYRP: 5.0000 mL | ORAL_SOLUTION | Freq: Three times a day (TID) | ORAL | 0 refills | Status: DC | PRN

## 2022-04-25 MED ORDER — AZITHROMYCIN 250 MG PO TABS: ORAL_TABLET | ORAL | 0 refills | Status: DC

## 2022-04-25 MED ORDER — PREDNISONE 50 MG PO TABS: 50.0000 mg | ORAL_TABLET | Freq: Every day | ORAL | 0 refills | Status: DC

## 2022-04-25 MED ORDER — BENZONATATE 100 MG PO CAPS: 100.0000 mg | ORAL_CAPSULE | Freq: Three times a day (TID) | ORAL | 0 refills | Status: DC | PRN

## 2022-04-25 NOTE — ED Triage Notes (Signed)
Pt c/o cough, nasal congestion x ~1 month-NAD-steady gait

## 2022-04-25 NOTE — ED Provider Notes (Signed)
[name]  Note:  This document was prepared using Systems analyst and may include unintentional dictation errors.  MRN: [id number] DOB: 08-Jan-1971  Subjective:   Cindy Bailey is a 52 y.o. female presenting for recheck on 1 month history of persistent coughing, coughing fits, chest congestion and chest pain from her coughing.  She also feels sinus drainage.  Initially when I saw her 04/01/2022, patient was agreeable to a trial of prednisone.  She states that it definitely helped but did not provide her with resolution of her symptoms.  She would now like a course of azithromycin that she has always done very well with this and has taken it multiple times over the years with complete resolution of her symptoms.  Patient plans on quitting smoking, has not been able to smoke while she has been sick this month.  No current facility-administered medications for this encounter.  Current Outpatient Medications:    albuterol (VENTOLIN HFA) 108 (90 Base) MCG/ACT inhaler, Inhale into the lungs., Disp: , Rfl:    buPROPion (WELLBUTRIN XL) 150 MG 24 hr tablet, Take 150 mg by mouth daily., Disp: , Rfl:    hydrOXYzine (ATARAX) 25 MG tablet, Take 25 mg by mouth 3 (three) times daily., Disp: , Rfl:    ibuprofen (ADVIL,MOTRIN) 200 MG tablet, Take 200 mg by mouth every 6 (six) hours as needed., Disp: , Rfl:    methylphenidate (RITALIN) 5 MG tablet, Take by mouth., Disp: , Rfl:    Multiple Vitamins-Minerals (MULTIVITAMIN ADULT PO), Take by mouth., Disp: , Rfl:    mupirocin ointment (BACTROBAN) 2 %, Apply to affected area twice daily for 7 days., Disp: 30 g, Rfl: 0   predniSONE (DELTASONE) 50 MG tablet, Take 1 tablet (50 mg total) by mouth daily with breakfast., Disp: 5 tablet, Rfl: 0   Probiotic Product (SOLUBLE FIBER/PROBIOTICS PO), Take by mouth., Disp: , Rfl:    promethazine-dextromethorphan (PROMETHAZINE-DM) 6.25-15 MG/5ML syrup, Take 5 mLs by mouth 3 (three)  times daily as needed for cough., Disp: 200 mL, Rfl: 0   traZODone (DESYREL) 100 MG tablet, Take by mouth., Disp: , Rfl:    Allergies  Allergen Reactions   Peanut-Containing Drug Products Hives   Amoxicillin Rash   Keflex [Cephalexin] Other (See Comments)    Stomach upset   Penicillins Rash    Past Medical History:  Diagnosis Date   Anxiety    Asthma    Bacterial vaginosis    PID (acute pelvic inflammatory disease)      Past Surgical History:  Procedure Laterality Date   WISDOM TOOTH EXTRACTION      No family history on file.  Social History   Tobacco Use   Smoking status: Every Day    Packs/day: 0.50    Types: Cigarettes   Smokeless tobacco: Never   Tobacco comments:    Mostly vaping  Vaping Use   Vaping Use: Former  Substance Use Topics   Alcohol use: Not Currently   Drug use: Not Currently    ROS   Objective:   Vitals: BP 115/81 (BP Location: Left Arm)   Pulse 72   Temp 98.3 F (36.8 C) (Oral)   Resp 20   LMP 05/10/2018 (Approximate)   SpO2 98%   Physical Exam Constitutional:      General: She is not in acute distress.    Appearance: Normal appearance. She is well-developed. She is not ill-appearing, toxic-appearing or diaphoretic.  HENT:     Head:  Normocephalic and atraumatic.     Right Ear: External ear normal.     Left Ear: External ear normal.     Nose: Nose normal. No congestion or rhinorrhea.     Mouth/Throat:     Mouth: Mucous membranes are moist.     Pharynx: No oropharyngeal exudate or posterior oropharyngeal erythema.  Eyes:     General: No scleral icterus.       Right eye: No discharge.        Left eye: No discharge.     Extraocular Movements: Extraocular movements intact.  Cardiovascular:     Rate and Rhythm: Normal rate and regular rhythm.     Heart sounds: Normal heart sounds. No murmur heard.    No friction rub. No gallop.  Pulmonary:     Effort: Pulmonary effort is normal. No respiratory distress.     Breath sounds: No  stridor. No wheezing, rhonchi or rales.  Chest:     Chest wall: No tenderness.  Skin:    General: Skin is warm and dry.  Neurological:     General: No focal deficit present.     Mental Status: She is alert and oriented to person, place, and time.  Psychiatric:        Mood and Affect: Mood normal.        Behavior: Behavior normal.     Assessment and Plan :   PDMP not reviewed this encounter.  1. Acute bronchitis, unspecified organism   2. Smoker   3. Persistent cough     Patient deferred imaging, would like to get a prescription of azithromycin.  Given the timeline of her illness, I was agreeable.  Emphasized that we would need to pursue imaging and consider a different antibiotic course if she continues to have symptoms.  She was okay with this.  Use supportive care otherwise. Counseled patient on potential for adverse effects with medications prescribed/recommended today, ER and return-to-clinic precautions discussed, patient verbalized understanding.    Jaynee Eagles, [place] 04/25/22 1121

## 2022-06-01 ENCOUNTER — Emergency Department (HOSPITAL_BASED_OUTPATIENT_CLINIC_OR_DEPARTMENT_OTHER)
Admission: EM | Admit: 2022-06-01 | Discharge: 2022-06-01 | Disposition: A | Discharge status: Home / Self Care | Payer: BLUE CROSS/BLUE SHIELD | Attending: Emergency Medicine | Admitting: Emergency Medicine

## 2022-06-01 ENCOUNTER — Encounter (HOSPITAL_BASED_OUTPATIENT_CLINIC_OR_DEPARTMENT_OTHER): Payer: Self-pay

## 2022-06-01 ENCOUNTER — Emergency Department (HOSPITAL_BASED_OUTPATIENT_CLINIC_OR_DEPARTMENT_OTHER): Payer: BLUE CROSS/BLUE SHIELD

## 2022-06-01 ENCOUNTER — Other Ambulatory Visit: Payer: Self-pay

## 2022-06-01 DIAGNOSIS — S99922A Unspecified injury of left foot, initial encounter: Secondary | ICD-10-CM | POA: Diagnosis present

## 2022-06-01 DIAGNOSIS — W3182XA Contact with other commercial machinery, initial encounter: Secondary | ICD-10-CM | POA: Insufficient documentation

## 2022-06-01 DIAGNOSIS — S9032XA Contusion of left foot, initial encounter: Secondary | ICD-10-CM | POA: Insufficient documentation

## 2022-06-01 NOTE — ED Notes (Signed)
Discharge instructions reviewed with patient. Patient verbalizes understanding, no further questions at this time. Medications and follow up information provided. No acute distress noted at time of departure.  

## 2022-06-01 NOTE — ED Provider Notes (Signed)
[place] [facility] Provider Note   CSN: [id number] Arrival date & time: 06/01/22  [id number]     History  Chief Complaint  Patient presents with   Foot Injury    Cindy Bailey is a 52 y.o. female.  Patient with no pertinent past medical history presents today with complaints of left foot injury.  She states that same occurred yesterday afternoon when she dropped a trash can on the top of her left foot.  She denies any other injuries or complaints.  She has been able to walk since but with some pain.  States she woke up this morning and noticed that it was swollen and was concerned that it could be broken.  The history is provided by the patient. No language interpreter was used.  Foot Injury      Home Medications Prior to Admission medications   Medication Sig Start Date End Date Taking? Authorizing Provider  albuterol (VENTOLIN HFA) 108 (90 Base) MCG/ACT inhaler Inhale into the lungs. 04/10/21   [provider]  azithromycin (ZITHROMAX) 250 MG tablet Day 1: take 2 tablets. Day 2-5: Take 1 tablet daily. 04/25/22   Jaynee Eagles, PA-C  benzonatate (TESSALON) 100 MG capsule Take 1 capsule (100 mg total) by mouth 3 (three) times daily as needed for cough. 04/25/22   Jaynee Eagles, PA-C  buPROPion (WELLBUTRIN XL) 150 MG 24 hr tablet Take 150 mg by mouth daily.    [provider]  hydrOXYzine (ATARAX) 25 MG tablet Take 25 mg by mouth 3 (three) times daily.    [provider]  ibuprofen (ADVIL,MOTRIN) 200 MG tablet Take 200 mg by mouth every 6 (six) hours as needed.    [provider]  methylphenidate (RITALIN) 5 MG tablet Take by mouth. 06/13/21   [provider]  Multiple Vitamins-Minerals (MULTIVITAMIN ADULT PO) Take by mouth.    [provider]  mupirocin ointment (BACTROBAN) 2 % Apply to affected area twice daily for 7 days. 08/23/21   Lynden Oxford Scales, PA-C  predniSONE (DELTASONE) 50 MG tablet Take  1 tablet (50 mg total) by mouth daily with breakfast. 04/25/22   Jaynee Eagles, PA-C  Probiotic Product (SOLUBLE FIBER/PROBIOTICS PO) Take by mouth.    [provider]  promethazine-dextromethorphan (PROMETHAZINE-DM) 6.25-15 MG/5ML syrup Take 5 mLs by mouth 3 (three) times daily as needed for cough. 04/25/22   Jaynee Eagles, PA-C  traZODone (DESYREL) 100 MG tablet Take by mouth. 06/13/21   [provider]      Allergies    Peanut-containing drug products, Amoxicillin, Keflex [cephalexin], and Penicillins    Review of Systems   Review of Systems  Musculoskeletal:  Positive for arthralgias.  All other systems reviewed and are negative.   Physical Exam Updated Vital Signs BP 91/69 (BP Location: Left Arm)   Pulse 69   Temp 98 F (36.7 C) (Oral)   Resp 17   Ht '5\' 7"'$  (1.702 m)   Wt 69.4 kg   LMP 05/10/2018 (Approximate)   SpO2 97%   BMI 23.96 kg/m  Physical Exam Vitals and nursing note reviewed.  Constitutional:      General: She is not in acute distress.    Appearance: Normal appearance. She is normal weight. She is not ill-appearing, toxic-appearing or diaphoretic.  HENT:     Head: Normocephalic and atraumatic.  Cardiovascular:     Rate and Rhythm: Normal rate.  Pulmonary:     Effort: Pulmonary effort is normal. No respiratory  distress.  Musculoskeletal:        General: Normal range of motion.     Cervical back: Normal range of motion.     Comments: Swelling noted to the dorsal aspect of the left foot.  Compartments soft, capillary refill less than 2 seconds.  DP and PT pulses intact and 2+.  No obvious deformity.  Patient observed to be ambulatory with steady gait.  Skin:    General: Skin is warm and dry.  Neurological:     General: No focal deficit present.     Mental Status: She is alert.  Psychiatric:        Mood and Affect: Mood normal.        Behavior: Behavior normal.     ED Results / Procedures / Treatments   Labs (all labs ordered are listed, but  only abnormal results are displayed) Labs Reviewed - No data to display  EKG None  Radiology DG Foot Complete Left  Result Date: 06/01/2022 CLINICAL DATA:  SWELLING DUE TO INJURY EXAM: LEFT FOOT - COMPLETE 3+ VIEW COMPARISON:  None Available. FINDINGS: No acute fracture or dislocation. Joint spaces and alignment are maintained. No area of erosion or osseous destruction. No unexpected radiopaque foreign body. Mild soft tissue edema overlying the dorsal foot. IMPRESSION: Mild soft tissue edema overlying the dorsal foot. No acute fracture or dislocation. Electronically Signed   By: Valentino Saxon M.D.   On: 06/01/2022 10:30    Procedures Procedures    Medications Ordered in ED Medications - No data to display  ED Course/ Medical Decision Making/ A&P                             Medical Decision Making Amount and/or Complexity of Data Reviewed Radiology: ordered.   Patient presents today with complaints of left foot injury yesterday evening.  She is afebrile, nontoxic-appearing, and in no acute distress with reassuring vital signs.  Physical exam reveals swelling to the dorsal aspect of the left foot.  Good capillary refill line compartments soft.  Pulses intact.  X-ray imaging obtained of the left foot and reveals   Mild soft tissue edema overlying the dorsal foot. No acute fracture or dislocation  Personally reviewed and interpreted this imaging and agree with radiology interpretation.  Pain likely due to contusion from trauma.  Patient given ankle brace per her request.  Pt advised to follow up with orthopedics if symptoms persist for possibility of missed fracture diagnosis.  Patient given a referral for same.  Conservative therapy with RICE and Tylenol/ibuprofen recommended and discussed. Evaluation and diagnostic testing in the emergency department does not suggest an emergent condition requiring admission or immediate intervention beyond what has been performed at this time.   Plan for discharge with close PCP follow-up.  Patient is understanding and amenable with plan, educated on red flag symptoms that would prompt immediate return.  Patient discharged in stable condition.   Final Clinical Impression(s) / ED Diagnoses Final diagnoses:  Contusion of left foot, initial encounter    Rx / DC Orders ED Discharge Orders     None     An After Visit Summary was printed and given to the patient.     Nestor Lewandowsky 06/01/22 1100    Tegeler, Gwenyth Allegra, MD 06/01/22 1308

## 2022-06-01 NOTE — Discharge Instructions (Signed)
As we discussed, your workup in the ER today was reassuring for acute findings.  X-ray imaging of your foot did not reveal any fracture or dislocation.  I recommend that you rest, ice, compress, and elevate your foot is much as possible to help reduce swelling and pain.  We have given you an ankle brace to wear for support as needed.  I also recommend that you take Tylenol/ibuprofen as needed for pain.  I have given you a referral to sports medicine with a number to call to schedule an appointment as needed if her symptoms do not begin to improve in the next few days.  Return if development of any new or worsening symptoms.

## 2022-06-01 NOTE — ED Triage Notes (Signed)
Pt ambulatory to triage  reports dropping trash can on left foot yesterday. Swelling noted to foot

## 2022-06-02 ENCOUNTER — Ambulatory Visit: Payer: BLUE CROSS/BLUE SHIELD | Admitting: Family Medicine

## 2022-06-06 ENCOUNTER — Ambulatory Visit: Payer: Self-pay

## 2022-06-07 ENCOUNTER — Ambulatory Visit (INDEPENDENT_AMBULATORY_CARE_PROVIDER_SITE_OTHER): Payer: BLUE CROSS/BLUE SHIELD

## 2022-06-07 ENCOUNTER — Ambulatory Visit
Admission: RE | Admit: 2022-06-07 | Discharge: 2022-06-07 | Disposition: A | Discharge status: Home / Self Care | Payer: BLUE CROSS/BLUE SHIELD | Source: Ambulatory Visit | Attending: Family Medicine | Admitting: Family Medicine

## 2022-06-07 VITALS — BP 120/80 | HR 81 | Temp 98.3°F | Resp 18

## 2022-06-07 DIAGNOSIS — J4521 Mild intermittent asthma with (acute) exacerbation: Secondary | ICD-10-CM | POA: Diagnosis not present

## 2022-06-07 DIAGNOSIS — S9032XD Contusion of left foot, subsequent encounter: Secondary | ICD-10-CM | POA: Diagnosis not present

## 2022-06-07 DIAGNOSIS — M79672 Pain in left foot: Secondary | ICD-10-CM

## 2022-06-07 DIAGNOSIS — F17200 Nicotine dependence, unspecified, uncomplicated: Secondary | ICD-10-CM

## 2022-06-07 DIAGNOSIS — J0141 Acute recurrent pansinusitis: Secondary | ICD-10-CM

## 2022-06-07 MED ORDER — DICLOFENAC SODIUM 75 MG PO TBEC: 75.0000 mg | DELAYED_RELEASE_TABLET | Freq: Two times a day (BID) | ORAL | 0 refills | Status: DC

## 2022-06-07 MED ORDER — PREDNISONE 20 MG PO TABS: 40.0000 mg | ORAL_TABLET | Freq: Every day | ORAL | 0 refills | Status: DC

## 2022-06-07 MED ORDER — AZITHROMYCIN 250 MG PO TABS: 250.0000 mg | ORAL_TABLET | Freq: Every day | ORAL | 0 refills | Status: DC

## 2022-06-07 NOTE — Discharge Instructions (Addendum)
With a sinus infection use Flonase daily.  Make sure you are drinking lots of water.  I have prescribed azithromycin antibiotic.  Take 2 pills today and then 1 a day until gone.  I have also prescribed prednisone.  Take daily for 5 days.  Follow-up with your primary care doctor if you fail to improve  Your foot x-ray does not show fracture.  You have a crush injury to your foot.  I understand that this is very painful.  Follow-up with sports medicine.  Call them in the morning for an appointment.  Call us if you have difficulty getting in.  Take Voltaren 2 times a day with food.  This is to take down the pain and swelling in your foot.  Wear boot at all times that you are walking

## 2022-06-07 NOTE — ED Provider Notes (Signed)
[facility]    CSN: [id number] Arrival date & time: 06/07/22  1032      History   Chief Complaint Chief Complaint  Patient presents with   Foot Injury    left    HPI Cindy Bailey is a 52 y.o. female.   HPI  Patient has 2 medical problems.  First she injured her foot a week ago.  A heavy trash can fell on top of the foot.  It is bruised, swollen, and very painful.  She went to the emergency room where x-rays were done and found to be negative.  She is concerned because she still can hardly put weight on the foot and there is deep purple bruising and swelling  Patient also has a "sinus infection".  Cough, cold, runny nose, postnasal drip, sinus pressure and pain.  Is been present for 2 weeks.  It is not getting better with over-the-counter medication.  Patient states she does have underlying asthma and she also is a smoker.  Past Medical History:  Diagnosis Date   Anxiety    Asthma    Bacterial vaginosis    PID (acute pelvic inflammatory disease)     There are no problems to display for this patient.   Past Surgical History:  Procedure Laterality Date   WISDOM TOOTH EXTRACTION      OB History     Gravida  0   Para  0   Term  0   Preterm  0   AB  0   Living  0      SAB  0   IAB  0   Ectopic  0   Multiple  0   Live Births  0            Home Medications    Prior to Admission medications   Medication Sig Start Date End Date Taking? Authorizing Provider  azithromycin (ZITHROMAX) 250 MG tablet Take 1 tablet (250 mg total) by mouth daily. Take first 2 tablets together, then 1 every day until finished. 06/07/22  Yes Raylene Everts, MD  diclofenac (VOLTAREN) 75 MG EC tablet Take 1 tablet (75 mg total) by mouth 2 (two) times daily. 06/07/22  Yes Raylene Everts, MD  predniSONE (DELTASONE) 20 MG tablet Take 2 tablets (40 mg total) by mouth daily with breakfast. 06/07/22  Yes Raylene Everts, MD  albuterol (VENTOLIN HFA) 108 (90  Base) MCG/ACT inhaler Inhale into the lungs. 04/10/21   [provider]  buPROPion (WELLBUTRIN XL) 150 MG 24 hr tablet Take 150 mg by mouth daily.    [provider]  hydrOXYzine (ATARAX) 25 MG tablet Take 25 mg by mouth 3 (three) times daily.    [provider]  methylphenidate (RITALIN) 5 MG tablet Take by mouth. 06/13/21   [provider]  Multiple Vitamins-Minerals (MULTIVITAMIN ADULT PO) Take by mouth. Patient not taking: Reported on 06/07/2022    [provider]  traZODone (DESYREL) 100 MG tablet Take by mouth. 06/13/21   [provider]    Family History Family History  Problem Relation Age of Onset   Stroke Father     Social History Social History   Tobacco Use   Smoking status: Every Day    Packs/day: .5    Types: Cigarettes   Smokeless tobacco: Never   Tobacco comments:    Mostly vaping  Vaping Use   Vaping Use: Former  Substance Use Topics   Alcohol use: Not Currently  Drug use: Not Currently     Allergies   Peanut-containing drug products, Amoxicillin, and Penicillins   Review of Systems Review of Systems   Physical Exam Triage Vital Signs ED Triage Vitals  Enc Vitals Group     BP 06/07/22 1058 120/80     Pulse Rate 06/07/22 1058 81     Resp 06/07/22 1058 18     Temp 06/07/22 1058 98.3 F (36.8 C)     Temp Source 06/07/22 1058 Oral     SpO2 06/07/22 1058 97 %     Weight --      Height --      Head Circumference --      Peak Flow --      Pain Score 06/07/22 1100 10     Pain Loc --      Pain Edu? --      Excl. in [place]? --    No data found.  Updated Vital Signs BP 120/80 (BP Location: Left Arm)   Pulse 81   Temp 98.3 F (36.8 C) (Oral)   Resp 18   LMP 05/10/2018 (Approximate)   SpO2 97%      Physical Exam Constitutional:      General: She is not in acute distress.    Appearance: She is well-developed. She is ill-appearing.  HENT:     Head: Normocephalic and atraumatic.     Right  Ear: Tympanic membrane and ear canal normal.     Left Ear: Tympanic membrane and ear canal normal.     Nose: Congestion and rhinorrhea present.     Mouth/Throat:     Mouth: Mucous membranes are moist.     Pharynx: No posterior oropharyngeal erythema.  Eyes:     Conjunctiva/sclera: Conjunctivae normal.     Pupils: Pupils are equal, round, and reactive to light.  Cardiovascular:     Rate and Rhythm: Normal rate and regular rhythm.     Heart sounds: Normal heart sounds.  Pulmonary:     Effort: Pulmonary effort is normal. No respiratory distress.     Breath sounds: Normal breath sounds.  Abdominal:     General: There is no distension.     Palpations: Abdomen is soft.  Musculoskeletal:        General: Normal range of motion.     Cervical back: Normal range of motion.       Feet:  Lymphadenopathy:     Cervical: No cervical adenopathy.  Skin:    General: Skin is warm and dry.  Neurological:     Mental Status: She is alert.     Gait: Gait abnormal.      UC Treatments / Results  Labs (all labs ordered are listed, but only abnormal results are displayed) Labs Reviewed - No data to display  EKG   Radiology DG Foot Complete Left  Result Date: 06/07/2022 CLINICAL DATA:  Dropped trash can on foot 1 week ago, pain EXAM: LEFT FOOT - COMPLETE 3+ VIEW COMPARISON:  None Available. FINDINGS: There is no evidence of fracture or dislocation. There is no evidence of arthropathy or other focal bone abnormality. Soft tissue edema over the dorsum of the foot. IMPRESSION: No fracture or dislocation of the left foot. Soft tissue edema over the dorsum of the foot. Electronically Signed   By: Delanna Ahmadi M.D.   On: 06/07/2022 11:58    Procedures Procedures (including critical care time)  Medications Ordered in UC Medications - No data to display  Initial Impression /  Assessment and Plan / UC Course  I have reviewed the triage vital signs and the nursing notes.  Pertinent labs & imaging  results that were available during my care of the patient were reviewed by me and considered in my medical decision making (see chart for details).    Final Clinical Impressions(s) / UC Diagnoses   Final diagnoses:  Acute recurrent pansinusitis  Mild intermittent asthma with acute exacerbation  Tobacco dependence  Contusion of left foot, subsequent encounter     Discharge Instructions      With a sinus infection use Flonase daily.  Make sure you are drinking lots of water.  I have prescribed azithromycin antibiotic.  Take 2 pills today and then 1 a day until gone.  I have also prescribed prednisone.  Take daily for 5 days.  Follow-up with your primary care doctor if you fail to improve  Your foot x-ray does not show fracture.  You have a crush injury to your foot.  I understand that this is very painful.  Follow-up with sports medicine.  Call them in the morning for an appointment.  Call us if you have difficulty getting in.  Take Voltaren 2 times a day with food.  This is to take down the pain and swelling in your foot.  Wear boot at all times that you are walking     ED Prescriptions     Medication Sig Dispense Auth. Provider   predniSONE (DELTASONE) 20 MG tablet Take 2 tablets (40 mg total) by mouth daily with breakfast. 10 tablet Raylene Everts, MD   azithromycin (ZITHROMAX) 250 MG tablet Take 1 tablet (250 mg total) by mouth daily. Take first 2 tablets together, then 1 every day until finished. 6 tablet Raylene Everts, MD   diclofenac (VOLTAREN) 75 MG EC tablet Take 1 tablet (75 mg total) by mouth 2 (two) times daily. 30 tablet Raylene Everts, MD      PDMP not reviewed this encounter.   Raylene Everts, MD 06/07/22 1249

## 2022-06-07 NOTE — ED Triage Notes (Signed)
Per pt, "Foot is bruised black and blue and may be fractured. Pt dropped a trash can on her foot.,.pain is 10 hurts to walk . Also having sinuses problems." Pt was seen at [facility] ED on 3/11- xrays done  No follow up w/ ortho or sports med  Pt wants left foot rechecked due continued pain  OTC  ibuprofen 800mg   BID  & tylenol 500mg  BID Takes meds at the same time- no relief  Pt has had sinus congestion and  pain x 3 weeks OTC  sinus  meds not working

## 2022-06-08 ENCOUNTER — Telehealth: Payer: Self-pay | Admitting: Family Medicine

## 2022-06-08 ENCOUNTER — Telehealth: Payer: Self-pay

## 2022-06-08 MED ORDER — CELECOXIB 100 MG PO CAPS: 100.0000 mg | ORAL_CAPSULE | Freq: Two times a day (BID) | ORAL | 0 refills | Status: AC

## 2022-06-08 NOTE — Telephone Encounter (Signed)
HIPAA compliant vm left

## 2022-06-08 NOTE — Telephone Encounter (Signed)
Patient called in requesting new medication reports that diclofenac was causing diarrhea.  Instructed patient to discontinue diclofenac and start Celebrex.  Instructed patient to take medication with food and to increase daily water intake to 64 ounces per day while taking this medication.

## 2022-06-09 ENCOUNTER — Telehealth: Payer: Self-pay | Admitting: Family Medicine

## 2022-06-09 ENCOUNTER — Telehealth: Payer: Self-pay | Admitting: Emergency Medicine

## 2022-06-09 MED ORDER — TRAMADOL HCL 50 MG PO TABS: 50.0000 mg | ORAL_TABLET | Freq: Three times a day (TID) | ORAL | 0 refills | Status: AC | PRN

## 2022-06-09 NOTE — Telephone Encounter (Signed)
Patient was initially prescribed diclofenac on visit of 06/07/2022.  Patient reports that this medication caused her diarrhea. I discontinue this medication and prescribe Celebrex patient called staff to report that she is concerned about having a stroke when taking Celebrex and request Tramadol be called in for her.

## 2022-06-09 NOTE — Telephone Encounter (Signed)
Call from patient requesting tramadol because she does not want to take the celebrex due to possible side effects- pt states she is concerned about having a stroke. Follow up with provider here today who states he will some in some tramadol - no refills. Pt verbalized an understanding

## 2022-06-29 ENCOUNTER — Ambulatory Visit
Admission: EM | Admit: 2022-06-29 | Discharge: 2022-06-29 | Disposition: A | Discharge status: Home / Self Care | Payer: BLUE CROSS/BLUE SHIELD

## 2022-06-29 DIAGNOSIS — J4530 Mild persistent asthma, uncomplicated: Secondary | ICD-10-CM | POA: Diagnosis not present

## 2022-06-29 DIAGNOSIS — J309 Allergic rhinitis, unspecified: Secondary | ICD-10-CM

## 2022-06-29 DIAGNOSIS — J00 Acute nasopharyngitis [common cold]: Secondary | ICD-10-CM | POA: Diagnosis not present

## 2022-06-29 MED ORDER — PSEUDOEPHEDRINE HCL 60 MG PO TABS: 60.0000 mg | ORAL_TABLET | Freq: Three times a day (TID) | ORAL | 0 refills | Status: DC | PRN

## 2022-06-29 MED ORDER — PREDNISONE 20 MG PO TABS: 40.0000 mg | ORAL_TABLET | Freq: Every day | ORAL | 0 refills | Status: DC

## 2022-06-29 MED ORDER — CETIRIZINE HCL 10 MG PO TABS: 10.0000 mg | ORAL_TABLET | Freq: Every day | ORAL | 0 refills | Status: DC

## 2022-06-29 NOTE — ED Provider Notes (Signed)
[name]  Note:  This document was prepared using Conservation officer, historic buildings and may include unintentional dictation errors.  MRN: [id number] DOB: 04-May-1970  Subjective:   Cindy Bailey is a 52 y.o. female presenting for 2-day history of acute onset sinus congestion, sinus pressure, difficulty breathing from her nose at all.  Patient reports history of significant allergic rhinitis.  She is also a smoker.  She has been using Afrin as recommended by one of her coworkers.  Has been seen in the past 2 months for recurrent sinusitis.  Has undergone 2 rounds of azithromycin.  Has also undergone prednisone.  Denies any significant cough, chest pain, shortness of breath or wheezing.  No current facility-administered medications for this encounter.  Current Outpatient Medications:    albuterol (VENTOLIN HFA) 108 (90 Base) MCG/ACT inhaler, Inhale into the lungs., Disp: , Rfl:    azithromycin (ZITHROMAX) 250 MG tablet, Take 1 tablet (250 mg total) by mouth daily. Take first 2 tablets together, then 1 every day until finished., Disp: 6 tablet, Rfl: 0   buPROPion (WELLBUTRIN XL) 150 MG 24 hr tablet, Take 150 mg by mouth daily., Disp: , Rfl:    busPIRone (BUSPAR) 10 MG tablet, Take by mouth., Disp: , Rfl:    diclofenac (VOLTAREN) 75 MG EC tablet, Take 1 tablet (75 mg total) by mouth 2 (two) times daily., Disp: 30 tablet, Rfl: 0   HYDROcodone-acetaminophen (NORCO/VICODIN) 5-325 MG tablet, Take by mouth., Disp: , Rfl:    hydrOXYzine (ATARAX) 25 MG tablet, Take 25 mg by mouth 3 (three) times daily., Disp: , Rfl:    methylphenidate (RITALIN) 5 MG tablet, Take by mouth., Disp: , Rfl:    Multiple Vitamins-Minerals (MULTIVITAMIN ADULT PO), Take by mouth. (Patient not taking: Reported on 06/07/2022), Disp: , Rfl:    predniSONE (DELTASONE) 20 MG tablet, Take 2 tablets (40 mg total) by mouth daily with breakfast., Disp: 10 tablet, Rfl: 0   traZODone (DESYREL) 100 MG tablet, Take  by mouth., Disp: , Rfl:    Allergies  Allergen Reactions   Peanut-Containing Drug Products Hives   Amoxicillin Rash   Penicillins Rash    Past Medical History:  Diagnosis Date   Anxiety    Asthma    Bacterial vaginosis    PID (acute pelvic inflammatory disease)      Past Surgical History:  Procedure Laterality Date   WISDOM TOOTH EXTRACTION      Family History  Problem Relation Age of Onset   Stroke Father     Social History   Tobacco Use   Smoking status: Every Day    Packs/day: .5    Types: Cigarettes   Smokeless tobacco: Never   Tobacco comments:    Mostly vaping  Vaping Use   Vaping Use: Former  Substance Use Topics   Alcohol use: Not Currently   Drug use: Not Currently    ROS   Objective:   Vitals: BP (!) 143/79 (BP Location: Right Arm)   Pulse 74   Temp 97.8 F (36.6 C) (Oral)   Resp 18   LMP 05/10/2018 (Approximate)   SpO2 93%   Physical Exam Constitutional:      General: She is not in acute distress.    Appearance: Normal appearance. She is well-developed and normal weight. She is not ill-appearing, toxic-appearing or diaphoretic.  HENT:     Head: Normocephalic and atraumatic.     Right Ear: Tympanic membrane, ear canal and external ear normal. No  drainage or tenderness. No middle ear effusion. There is no impacted cerumen. Tympanic membrane is not erythematous or bulging.     Left Ear: Tympanic membrane, ear canal and external ear normal. No drainage or tenderness.  No middle ear effusion. There is no impacted cerumen. Tympanic membrane is not erythematous or bulging.     Nose: Congestion and rhinorrhea present.     Mouth/Throat:     Mouth: Mucous membranes are moist. No oral lesions.     Pharynx: No pharyngeal swelling, oropharyngeal exudate, posterior oropharyngeal erythema or uvula swelling.     Tonsils: No tonsillar exudate or tonsillar abscesses.  Eyes:     General: No scleral icterus.       Right eye: No discharge.        Left  eye: No discharge.     Extraocular Movements: Extraocular movements intact.     Right eye: Normal extraocular motion.     Left eye: Normal extraocular motion.     Conjunctiva/sclera: Conjunctivae normal.  Cardiovascular:     Rate and Rhythm: Normal rate and regular rhythm.     Heart sounds: Normal heart sounds. No murmur heard.    No friction rub. No gallop.  Pulmonary:     Effort: Pulmonary effort is normal. No respiratory distress.     Breath sounds: No stridor. No wheezing, rhonchi or rales.  Chest:     Chest wall: No tenderness.  Musculoskeletal:     Cervical back: Normal range of motion and neck supple.  Lymphadenopathy:     Cervical: No cervical adenopathy.  Skin:    General: Skin is warm and dry.  Neurological:     General: No focal deficit present.     Mental Status: She is alert and oriented to person, place, and time.  Psychiatric:        Mood and Affect: Mood normal.        Behavior: Behavior normal.     Assessment and Plan :   PDMP not reviewed this encounter.  1. Acute rhinitis   2. Allergic rhinitis, unspecified seasonality, unspecified trigger   3. Mild persistent asthma, uncomplicated     Will defer further antibiotic use at this stage.  Patient is in agreement.  Recommended she avoid Afrin completely.  Use Zyrtec and prednisone for recurrent allergic rhinitis flare.  Following the prednisone course, can use pseudoephedrine as needed.  Deferred imaging given clear cardiopulmonary exam, hemodynamically stable vital signs.    Wallis Bamberg, PA-C 06/29/22 1032

## 2022-06-29 NOTE — ED Triage Notes (Signed)
Pt reports cough, sinus pressure and nasal congestion x 2 days.

## 2022-07-06 ENCOUNTER — Encounter: Payer: Self-pay | Admitting: *Deleted

## 2022-07-20 ENCOUNTER — Ambulatory Visit
Admission: RE | Admit: 2022-07-20 | Discharge: 2022-07-20 | Disposition: A | Discharge status: Home / Self Care | Payer: BLUE CROSS/BLUE SHIELD | Source: Ambulatory Visit | Attending: Family Medicine | Admitting: Family Medicine

## 2022-07-20 VITALS — BP 146/86 | HR 85 | Temp 99.0°F | Resp 18

## 2022-07-20 DIAGNOSIS — J0100 Acute maxillary sinusitis, unspecified: Secondary | ICD-10-CM

## 2022-07-20 DIAGNOSIS — R059 Cough, unspecified: Secondary | ICD-10-CM | POA: Diagnosis not present

## 2022-07-20 DIAGNOSIS — R062 Wheezing: Secondary | ICD-10-CM | POA: Diagnosis not present

## 2022-07-20 MED ORDER — AZITHROMYCIN 250 MG PO TABS: 250.0000 mg | ORAL_TABLET | Freq: Every day | ORAL | 0 refills | Status: DC

## 2022-07-20 MED ORDER — PREDNISONE 20 MG PO TABS: ORAL_TABLET | ORAL | 0 refills | Status: DC

## 2022-07-20 MED ORDER — BENZONATATE 200 MG PO CAPS: 200.0000 mg | ORAL_CAPSULE | Freq: Three times a day (TID) | ORAL | 0 refills | Status: AC | PRN

## 2022-07-20 NOTE — Discharge Instructions (Addendum)
Instructed patient to take medications as directed with food to completion.  Advised patient to take prednisone and Zithromax daily for the next 5 days.  Advised may take Tessalon daily or as needed for cough. Encouraged increase daily water intake to 64 ounces per day while taking these medications.  Advised if symptoms worsen and/or unresolved please follow-up with PCP or ENT for further evaluation.  Advised we will follow-up with COVID-19 results once received.

## 2022-07-20 NOTE — ED Provider Notes (Signed)
[facility]    CSN: [id number] Arrival date & time: 07/20/22  0857      History   Chief Complaint Chief Complaint  Patient presents with   Nasal Congestion   Cough    HPI Cindy Bailey is a 52 y.o. female.   HPI 52 year old female presents with cough, wheezing, nasal congestion, and sinus pressure since Saturday of last week.  Reports coworker traveling to [place] and returning with similar symptoms.  PMH significant for anxiety, asthma, and PID.  Reports history of ongoing sinus infections and plans to be evaluated by ENT soon.  Past Medical History:  Diagnosis Date   Anxiety    Asthma    Bacterial vaginosis    PID (acute pelvic inflammatory disease)     There are no problems to display for this patient.   Past Surgical History:  Procedure Laterality Date   WISDOM TOOTH EXTRACTION      OB History     Gravida  0   Para  0   Term  0   Preterm  0   AB  0   Living  0      SAB  0   IAB  0   Ectopic  0   Multiple  0   Live Births  0            Home Medications    Prior to Admission medications   Medication Sig Start Date End Date Taking? Authorizing Provider  azithromycin (ZITHROMAX) 250 MG tablet Take 1 tablet (250 mg total) by mouth daily. Take first 2 tablets together, then 1 every day until finished. 07/20/22  Yes Trevor Iha, FNP  benzonatate (TESSALON) 200 MG capsule Take 1 capsule (200 mg total) by mouth 3 (three) times daily as needed for up to 7 days. 07/20/22 07/27/22 Yes Trevor Iha, FNP  predniSONE (DELTASONE) 20 MG tablet Take 3 tabs PO daily x 5 days. 07/20/22  Yes Trevor Iha, FNP  albuterol (VENTOLIN HFA) 108 (90 Base) MCG/ACT inhaler Inhale into the lungs. 04/10/21   [provider]  buPROPion (WELLBUTRIN XL) 150 MG 24 hr tablet Take 150 mg by mouth daily.    [provider]  busPIRone (BUSPAR) 10 MG tablet Take by mouth.    [provider]  cetirizine (ZYRTEC ALLERGY) 10 MG tablet  Take 1 tablet (10 mg total) by mouth daily. 06/29/22   Wallis Bamberg, PA-C  diclofenac (VOLTAREN) 75 MG EC tablet Take 1 tablet (75 mg total) by mouth 2 (two) times daily. 06/07/22   Eustace Moore, MD  hydrOXYzine (ATARAX) 25 MG tablet Take 25 mg by mouth 3 (three) times daily.    [provider]  methylphenidate (RITALIN) 5 MG tablet Take by mouth. 06/13/21   [provider]  Multiple Vitamins-Minerals (MULTIVITAMIN ADULT PO) Take by mouth. Patient not taking: Reported on 06/07/2022    [provider]  pseudoephedrine (SUDAFED) 60 MG tablet Take 1 tablet (60 mg total) by mouth every 8 (eight) hours as needed for congestion. 06/29/22   Wallis Bamberg, PA-C  traZODone (DESYREL) 100 MG tablet Take by mouth. 06/13/21   [provider]    Family History Family History  Problem Relation Age of Onset   Stroke Father     Social History Social History   Tobacco Use   Smoking status: Every Day    Packs/day: .5    Types: Cigarettes   Smokeless tobacco: Never   Tobacco comments:  Mostly vaping  Vaping Use   Vaping Use: Former  Substance Use Topics   Alcohol use: Not Currently   Drug use: Not Currently     Allergies   Peanut-containing drug products, Amoxicillin, and Penicillins   Review of Systems Review of Systems  HENT:  Positive for congestion.   Respiratory:  Positive for cough.   All other systems reviewed and are negative.    Physical Exam Triage Vital Signs ED Triage Vitals  Enc Vitals Group     BP      Pulse      Resp      Temp      Temp src      SpO2      Weight      Height      Head Circumference      Peak Flow      Pain Score      Pain Loc      Pain Edu?      Excl. in GC?    No data found.  Updated Vital Signs BP (!) 146/86 (BP Location: Left Arm)   Pulse 85   Temp 99 F (37.2 C) (Oral)   Resp 18   LMP 05/10/2018 (Approximate)   SpO2 94%    Physical Exam Vitals and nursing note reviewed.  Constitutional:       Appearance: Normal appearance. She is normal weight. She is ill-appearing.  HENT:     Head: Normocephalic and atraumatic.     Right Ear: External ear normal.     Left Ear: External ear normal.     Ears:     Comments: TM's are dull, retracted bilaterally; significant eustachian tube dysfunction noted bilaterally    Mouth/Throat:     Mouth: Mucous membranes are moist.     Pharynx: Oropharynx is clear.  Eyes:     Extraocular Movements: Extraocular movements intact.     Conjunctiva/sclera: Conjunctivae normal.     Pupils: Pupils are equal, round, and reactive to light.  Cardiovascular:     Rate and Rhythm: Normal rate and regular rhythm.     Pulses: Normal pulses.     Heart sounds: Normal heart sounds.  Pulmonary:     Effort: Pulmonary effort is normal.     Breath sounds: Wheezing present. No rhonchi or rales.     Comments: Frequent nonproductive cough, several inspiratory wheezes noted Musculoskeletal:        General: Normal range of motion.     Cervical back: Normal range of motion and neck supple.  Skin:    General: Skin is warm and dry.  Neurological:     General: No focal deficit present.     Mental Status: She is alert and oriented to person, place, and time. Mental status is at baseline.  Psychiatric:        Mood and Affect: Mood normal.        Behavior: Behavior normal.        Thought Content: Thought content normal.      UC Treatments / Results  Labs (all labs ordered are listed, but only abnormal results are displayed) Labs Reviewed  SARS CORONAVIRUS 2 (TAT 6-24 HRS)    EKG   Radiology No results found.  Procedures Procedures (including critical care time)  Medications Ordered in UC Medications - No data to display  Initial Impression / Assessment and Plan / UC Course  I have reviewed the triage vital signs and the nursing notes.  Pertinent labs &  imaging results that were available during my care of the patient were reviewed by me and considered in  my medical decision making (see chart for details).     MDM: 1.  Acute maxillary sinusitis, recurrence not specified-Rx'd Zithromax (500 mg day 1, then 250 mg daily x 4 days; 2.  Cough, unspecified type lab 4422 ordered, Tessalon 200 mg 3 times daily, as needed; 3.  Wheeze-Rx'd prednisone 60 mg daily x 5 days. Instructed patient to take medications as directed with food to completion.  Advised patient to take prednisone and Zithromax daily for the next 5 days.  Advised may take Tessalon daily or as needed for cough. Encouraged increase daily water intake to 64 ounces per day while taking these medications.  Advised if symptoms worsen and/or unresolved please follow-up with PCP or ENT for further evaluation.  Advised we will follow-up with COVID-19 results once received.  Work note provided to patient prior to discharge today.  Patient discharged home, hemodynamically stable. Final Clinical Impressions(s) / UC Diagnoses   Final diagnoses:  Acute maxillary sinusitis, recurrence not specified  Cough, unspecified type  Wheeze     Discharge Instructions      Instructed patient to take medications as directed with food to completion.  Advised patient to take prednisone and Zithromax daily for the next 5 days.  Advised may take Tessalon daily or as needed for cough. Encouraged increase daily water intake to 64 ounces per day while taking these medications.  Advised if symptoms worsen and/or unresolved please follow-up with PCP or ENT for further evaluation.  Advised we will follow-up with COVID-19 results once received.     ED Prescriptions     Medication Sig Dispense Auth. Provider   azithromycin (ZITHROMAX) 250 MG tablet Take 1 tablet (250 mg total) by mouth daily. Take first 2 tablets together, then 1 every day until finished. 6 tablet Trevor Iha, FNP   predniSONE (DELTASONE) 20 MG tablet Take 3 tabs PO daily x 5 days. 15 tablet Trevor Iha, FNP   benzonatate (TESSALON) 200 MG capsule  Take 1 capsule (200 mg total) by mouth 3 (three) times daily as needed for up to 7 days. 40 capsule Trevor Iha, FNP      PDMP not reviewed this encounter.   Trevor Iha, FNP 07/20/22 [phone]

## 2022-07-20 NOTE — ED Triage Notes (Signed)
Pt c/o cough, nasal congestion and sinus pressure since Saturday. Ibuprofen and sudafed prn.

## 2022-07-21 LAB — SARS CORONAVIRUS 2 (TAT 6-24 HRS): SARS Coronavirus 2: NEGATIVE

## 2023-03-27 ENCOUNTER — Ambulatory Visit
Admission: RE | Admit: 2023-03-27 | Discharge: 2023-03-27 | Disposition: A | Discharge status: Home / Self Care | Payer: BLUE CROSS/BLUE SHIELD | Source: Ambulatory Visit | Attending: Family Medicine | Admitting: Family Medicine

## 2023-03-27 VITALS — BP 127/68 | HR 66 | Temp 97.4°F | Resp 20

## 2023-03-27 DIAGNOSIS — M5416 Radiculopathy, lumbar region: Secondary | ICD-10-CM

## 2023-03-27 DIAGNOSIS — S39012A Strain of muscle, fascia and tendon of lower back, initial encounter: Secondary | ICD-10-CM

## 2023-03-27 DIAGNOSIS — M545 Low back pain, unspecified: Secondary | ICD-10-CM

## 2023-03-27 MED ORDER — PREDNISONE 50 MG PO TABS: 50.0000 mg | ORAL_TABLET | Freq: Every day | ORAL | 0 refills | Status: DC

## 2023-03-27 NOTE — ED Provider Notes (Signed)
 [name]  Note:  This document was prepared using Conservation officer, historic buildings and may include unintentional dictation errors.  MRN: [id number] DOB: [birth year]  Subjective:   Cindy Bailey is a 53 y.o. female presenting for 10-day history of acute onset persistent low back pain that radiates toward both eyes.  Patient reports that she pulled her back.  Documentation shows that this was a work injury.  However, she states that she is not pursuing it as such.  She has been seen at [facility] and had x-rays done which were negative.  Has been using meloxicam which is not helping her.  No changes to bowel or urinary habits.  No fever, nausea, vomiting abdominal or pelvic pain, urinary symptoms, hematuria.  No history of back issues.  No current facility-administered medications for this encounter.  Current Outpatient Medications:    albuterol (VENTOLIN HFA) 108 (90 Base) MCG/ACT inhaler, Inhale into the lungs., Disp: , Rfl:    azithromycin  (ZITHROMAX ) 250 MG tablet, Take 1 tablet (250 mg total) by mouth daily. Take first 2 tablets together, then 1 every day until finished., Disp: 6 tablet, Rfl: 0   buPROPion (WELLBUTRIN XL) 150 MG 24 hr tablet, Take 150 mg by mouth daily., Disp: , Rfl:    busPIRone  (BUSPAR ) 10 MG tablet, Take by mouth., Disp: , Rfl:    cetirizine  (ZYRTEC  ALLERGY) 10 MG tablet, Take 1 tablet (10 mg total) by mouth daily., Disp: 90 tablet, Rfl: 0   diclofenac  (VOLTAREN ) 75 MG EC tablet, Take 1 tablet (75 mg total) by mouth 2 (two) times daily., Disp: 30 tablet, Rfl: 0   hydrOXYzine (ATARAX) 25 MG tablet, Take 25 mg by mouth 3 (three) times daily., Disp: , Rfl:    methylphenidate (RITALIN) 5 MG tablet, Take by mouth., Disp: , Rfl:    Multiple Vitamins-Minerals (MULTIVITAMIN ADULT PO), Take by mouth. (Patient not taking: Reported on 06/07/2022), Disp: , Rfl:    predniSONE  (DELTASONE ) 20 MG tablet, Take 3 tabs PO daily x 5 days., Disp: 15 tablet,  Rfl: 0   pseudoephedrine  (SUDAFED) 60 MG tablet, Take 1 tablet (60 mg total) by mouth every 8 (eight) hours as needed for congestion., Disp: 30 tablet, Rfl: 0   traZODone (DESYREL) 100 MG tablet, Take by mouth., Disp: , Rfl:    Allergies  Allergen Reactions   Peanut-Containing Drug Products Hives   Amoxicillin Rash   Penicillins Rash    Past Medical History:  Diagnosis Date   Anxiety    Asthma    Bacterial vaginosis    PID (acute pelvic inflammatory disease)      Past Surgical History:  Procedure Laterality Date   WISDOM TOOTH EXTRACTION      Family History  Problem Relation Age of Onset   Stroke Father     Social History   Tobacco Use   Smoking status: Every Day    Current packs/day: 0.50    Types: Cigarettes   Smokeless tobacco: Never   Tobacco comments:    Mostly vaping  Vaping Use   Vaping status: Former  Substance Use Topics   Alcohol use: Not Currently   Drug use: Not Currently    ROS   Objective:   Vitals: BP 127/68 (BP Location: Right Arm)   Pulse 66   Temp (!) 97.4 F (36.3 C) (Oral)   Resp 20   LMP 05/10/2018 (Approximate)   SpO2 98%   Physical Exam Constitutional:      General: She  is not in acute distress.    Appearance: Normal appearance. She is well-developed. She is not ill-appearing, toxic-appearing or diaphoretic.  HENT:     Head: Normocephalic and atraumatic.     Nose: Nose normal.     Mouth/Throat:     Mouth: Mucous membranes are moist.  Eyes:     General: No scleral icterus.       Right eye: No discharge.        Left eye: No discharge.     Extraocular Movements: Extraocular movements intact.  Cardiovascular:     Rate and Rhythm: Normal rate.  Pulmonary:     Effort: Pulmonary effort is normal.  Musculoskeletal:     Thoracic back: Spasms and tenderness present. No swelling, edema, deformity, signs of trauma, lacerations or bony tenderness. Normal range of motion. No scoliosis.     Lumbar back: Spasms and tenderness  present. No swelling, edema, deformity, signs of trauma, lacerations or bony tenderness. Decreased range of motion. Negative right straight leg raise test and negative left straight leg raise test. No scoliosis.  Skin:    General: Skin is warm and dry.  Neurological:     General: No focal deficit present.     Mental Status: She is alert and oriented to person, place, and time.  Psychiatric:        Mood and Affect: Mood normal.        Behavior: Behavior normal.    Anatomical Region Laterality Modality  Spine -- Digital Radiography   Impression  Minimal degenerative changes.   Electronically Signed   By: Ranell Bring M.D.   On: 03/25/2023 14:54 Narrative  CLINICAL DATA:  Pain.  No reported history of trauma  EXAM: THORACIC SPINE 2 VIEWS; LUMBAR SPINE - 2 VIEW  COMPARISON:  None Available.  FINDINGS: Preserved vertebral body heights, disc height and alignment. No listhesis. Minimal endplate osteophytes identified along the midthoracic spine and upper lumbar spine. Preserved bone mineralization. There are 6 non-rib-bearing lumbar-type vertebral bodies. Procedure Note  Bring Ranell Sor, MD - 03/25/2023 Formatting of this note might be different from the original. CLINICAL DATA:  Pain.  No reported history of trauma  EXAM: THORACIC SPINE 2 VIEWS; LUMBAR SPINE - 2 VIEW  COMPARISON:  None Available.  FINDINGS: Preserved vertebral body heights, disc height and alignment. No listhesis. Minimal endplate osteophytes identified along the midthoracic spine and upper lumbar spine. Preserved bone mineralization. There are 6 non-rib-bearing lumbar-type vertebral bodies.  IMPRESSION: Minimal degenerative changes.   Electronically Signed   By: Ranell Bring M.D.   On: 03/25/2023 14:54 Exam End: 03/25/23 12:05   Specimen Collected: 03/25/23 14:53 Last Resulted: 03/25/23 14:54  Received From: [facility]  Result Received: 03/27/23 12:04    Assessment and Plan :    PDMP not reviewed this encounter.  1. Lumbar radiculopathy   2. Lumbar strain, initial encounter   3. Severe low back pain    Imaging and medical record from a different healthcare system reviewed.  Patient requested Norco but I refused.  Recommended a oral prednisone  course.  Use a muscle relaxant.  Discussed back care.  Follow-up with the spine specialty clinic.  Counseled patient on potential for adverse effects with medications prescribed/recommended today, ER and return-to-clinic precautions discussed, patient verbalized understanding.    Christopher Savannah, [place] 03/27/23 1415

## 2023-03-27 NOTE — ED Triage Notes (Signed)
 Pt c/o pain to lower back "that radiates up to the top and goes down my legs"-states she "pulled my back just before Christmas and again this week"-states she was seen at [facility] 2-3 days ago/xray done rx x 2-NAD-steady gait

## 2024-02-09 ENCOUNTER — Ambulatory Visit: Payer: Self-pay

## 2024-02-10 ENCOUNTER — Ambulatory Visit
Admission: RE | Admit: 2024-02-10 | Discharge: 2024-02-10 | Disposition: A | Discharge status: Home / Self Care | Attending: Family Medicine | Admitting: Family Medicine

## 2024-02-10 ENCOUNTER — Ambulatory Visit: Payer: Self-pay

## 2024-02-10 VITALS — BP 146/62 | HR 76 | Temp 98.0°F | Resp 17

## 2024-02-10 DIAGNOSIS — R0981 Nasal congestion: Secondary | ICD-10-CM

## 2024-02-10 DIAGNOSIS — J069 Acute upper respiratory infection, unspecified: Secondary | ICD-10-CM | POA: Diagnosis not present

## 2024-02-10 MED ORDER — PREDNISONE 20 MG PO TABS: ORAL_TABLET | ORAL | 0 refills | Status: DC

## 2024-02-10 MED ORDER — AZITHROMYCIN 250 MG PO TABS: 250.0000 mg | ORAL_TABLET | Freq: Every day | ORAL | 0 refills | Status: DC

## 2024-02-10 NOTE — ED Provider Notes (Signed)
 [facility]    CSN: [id number] Arrival date & time: 02/10/24  1241      History   Chief Complaint Chief Complaint  Patient presents with   Nasal Congestion   Fatigue    HPI Cindy Bailey is a 53 y.o. female.   HPI 53 year old female presents with nasal congestion loss of voice, lethargy, postnasal drip for 2 weeks.  PMH significant for anxiety, ADHD, and PID.  Past Medical History:  Diagnosis Date   Anxiety    Asthma    Bacterial vaginosis    PID (acute pelvic inflammatory disease)     There are no active problems to display for this patient.   Past Surgical History:  Procedure Laterality Date   WISDOM TOOTH EXTRACTION      OB History     Gravida  0   Para  0   Term  0   Preterm  0   AB  0   Living  0      SAB  0   IAB  0   Ectopic  0   Multiple  0   Live Births  0            Home Medications    Prior to Admission medications   Medication Sig Start Date End Date Taking? Authorizing Provider  azithromycin  (ZITHROMAX ) 250 MG tablet Take 1 tablet (250 mg total) by mouth daily. Take first 2 tablets together, then 1 every day until finished. 02/10/24  Yes Teddy Sharper, FNP  predniSONE  (DELTASONE ) 20 MG tablet Take 3 tabs PO daily x 5 days. 02/10/24  Yes Teddy Sharper, FNP  albuterol (VENTOLIN HFA) 108 (90 Base) MCG/ACT inhaler Inhale into the lungs. 04/10/21   [provider]  buPROPion (WELLBUTRIN XL) 150 MG 24 hr tablet Take 150 mg by mouth daily.    [provider]  busPIRone  (BUSPAR ) 10 MG tablet Take by mouth.    [provider]  cetirizine  (ZYRTEC  ALLERGY) 10 MG tablet Take 1 tablet (10 mg total) by mouth daily. 06/29/22   Christopher Savannah, PA-C  diclofenac  (VOLTAREN ) 75 MG EC tablet Take 1 tablet (75 mg total) by mouth 2 (two) times daily. 06/07/22   Maranda Jamee Jacob, MD  hydrOXYzine (ATARAX) 25 MG tablet Take 25 mg by mouth 3 (three) times daily.    [provider]  methylphenidate  (RITALIN) 5 MG tablet Take by mouth. 06/13/21   [provider]  Multiple Vitamins-Minerals (MULTIVITAMIN ADULT PO) Take by mouth. Patient not taking: Reported on 06/07/2022    [provider]  traZODone (DESYREL) 100 MG tablet Take by mouth. 06/13/21   [provider]    Family History Family History  Problem Relation Age of Onset   Stroke Father     Social History Social History   Tobacco Use   Smoking status: Every Day    Current packs/day: 0.50    Types: Cigarettes   Smokeless tobacco: Never   Tobacco comments:    Mostly vaping  Vaping Use   Vaping status: Former  Substance Use Topics   Alcohol use: Not Currently   Drug use: Not Currently     Allergies   Peanut-containing drug products, Amoxicillin, and Penicillins   Review of Systems Review of Systems  Constitutional:  Positive for fatigue.  HENT:  Positive for congestion and postnasal drip.   All other systems reviewed and are negative.    Physical Exam Triage Vital Signs ED Triage Vitals  Encounter  Vitals Group     BP      Girls Systolic BP Percentile      Girls Diastolic BP Percentile      Boys Systolic BP Percentile      Boys Diastolic BP Percentile      Pulse      Resp      Temp      Temp src      SpO2      Weight      Height      Head Circumference      Peak Flow      Pain Score      Pain Loc      Pain Education      Exclude from Growth Chart    No data found.  Updated Vital Signs BP (!) 146/62 (BP Location: Right Arm)   Pulse 76   Temp 98 F (36.7 C) (Oral)   Resp 17   LMP 05/10/2018 (Approximate)   SpO2 95%   Visual Acuity Right Eye Distance:   Left Eye Distance:   Bilateral Distance:    Right Eye Near:   Left Eye Near:    Bilateral Near:     Physical Exam Vitals and nursing note reviewed.  Constitutional:      General: She is not in acute distress.    Appearance: Normal appearance. She is normal weight. She is ill-appearing. She is not  toxic-appearing or diaphoretic.  HENT:     Head: Normocephalic and atraumatic.     Right Ear: Tympanic membrane, ear canal and external ear normal.     Left Ear: Tympanic membrane, ear canal and external ear normal.     Mouth/Throat:     Mouth: Mucous membranes are moist.     Pharynx: Oropharynx is clear.  Eyes:     Extraocular Movements: Extraocular movements intact.     Conjunctiva/sclera: Conjunctivae normal.     Pupils: Pupils are equal, round, and reactive to light.  Cardiovascular:     Rate and Rhythm: Normal rate and regular rhythm.     Heart sounds: Normal heart sounds.  Pulmonary:     Effort: Pulmonary effort is normal.     Breath sounds: Normal breath sounds. No wheezing, rhonchi or rales.     Comments: Infrequent nonproductive cough on exam Musculoskeletal:        General: Normal range of motion.  Skin:    General: Skin is warm and dry.  Neurological:     General: No focal deficit present.     Mental Status: She is alert and oriented to person, place, and time. Mental status is at baseline.  Psychiatric:        Mood and Affect: Mood normal.        Behavior: Behavior normal.      UC Treatments / Results  Labs (all labs ordered are listed, but only abnormal results are displayed) Labs Reviewed - No data to display  EKG   Radiology No results found.  Procedures Procedures (including critical care time)  Medications Ordered in UC Medications - No data to display  Initial Impression / Assessment and Plan / UC Course  I have reviewed the triage vital signs and the nursing notes.  Pertinent labs & imaging results that were available during my care of the patient were reviewed by me and considered in my medical decision making (see chart for details).     MDM: 1.  Acute upper respiratory infection-Rx'd Zithromax  (take as directed);  2.  Congestion of nasal sinus-Rx'd prednisone  20 mg tablet: Take 3 tablets p.o. daily x 5 days. Advised patient take medication  as directed with food to completion.  Advised patient to take prednisone  with Zithromax  daily to completion.  Encouraged increase daily water intake to 64 ounces per day while taking these medications.  Advised if symptoms worsen and/or unresolved please follow-up with PCP or here for further evaluation.  Patient discharged home, hemodynamically stable. Final Clinical Impressions(s) / UC Diagnoses   Final diagnoses:  Acute upper respiratory infection  Congestion of nasal sinus     Discharge Instructions      Advised patient take medication as directed with food to completion.  Advised patient to take prednisone  with Zithromax  daily to completion.  Encouraged increase daily water intake to 64 ounces per day while taking these medications.  Advised if symptoms worsen and/or unresolved please follow-up with PCP or here for further evaluation.     ED Prescriptions     Medication Sig Dispense Auth. Provider   azithromycin  (ZITHROMAX ) 250 MG tablet Take 1 tablet (250 mg total) by mouth daily. Take first 2 tablets together, then 1 every day until finished. 6 tablet Guneet Delpino, FNP   predniSONE  (DELTASONE ) 20 MG tablet Take 3 tabs PO daily x 5 days. 15 tablet Sarah Baez, FNP      PDMP not reviewed this encounter.   Teddy Sharper, FNP 02/10/24 1322

## 2024-02-10 NOTE — ED Triage Notes (Signed)
 Pt c/o nasal congestion, cough, hoarseness and lethargy x 2 weeks. Takes Claritin daily. Also taking OTC cough and cold prn.

## 2024-02-10 NOTE — Discharge Instructions (Addendum)
 Advised patient take medication as directed with food to completion.  Advised patient to take prednisone  with Zithromax  daily to completion.  Encouraged increase daily water intake to 64 ounces per day while taking these medications.  Advised if symptoms worsen and/or unresolved please follow-up with PCP or here for further evaluation.

## 2024-02-22 ENCOUNTER — Ambulatory Visit
Admission: RE | Admit: 2024-02-22 | Discharge: 2024-02-22 | Disposition: A | Discharge status: Home / Self Care | Payer: Self-pay | Attending: Family Medicine | Admitting: Family Medicine

## 2024-02-22 ENCOUNTER — Other Ambulatory Visit: Payer: Self-pay

## 2024-02-22 ENCOUNTER — Ambulatory Visit

## 2024-02-22 VITALS — BP 132/91 | HR 67 | Temp 97.9°F | Resp 18 | Ht 67.0 in | Wt 143.0 lb

## 2024-02-22 DIAGNOSIS — R0602 Shortness of breath: Secondary | ICD-10-CM | POA: Diagnosis not present

## 2024-02-22 DIAGNOSIS — R052 Subacute cough: Secondary | ICD-10-CM

## 2024-02-22 DIAGNOSIS — J209 Acute bronchitis, unspecified: Secondary | ICD-10-CM

## 2024-02-22 MED ORDER — BENZONATATE 200 MG PO CAPS: 200.0000 mg | ORAL_CAPSULE | Freq: Two times a day (BID) | ORAL | 0 refills | Status: AC | PRN

## 2024-02-22 MED ORDER — PREDNISONE 20 MG PO TABS: 40.0000 mg | ORAL_TABLET | Freq: Every day | ORAL | 0 refills | Status: AC

## 2024-02-22 MED ORDER — DOXYCYCLINE HYCLATE 100 MG PO CAPS: 100.0000 mg | ORAL_CAPSULE | Freq: Two times a day (BID) | ORAL | 0 refills | Status: AC

## 2024-02-22 MED ORDER — DM-GUAIFENESIN ER 30-600 MG PO TB12: 1.0000 | ORAL_TABLET | Freq: Two times a day (BID) | ORAL | 0 refills | Status: AC

## 2024-02-22 NOTE — ED Provider Notes (Signed)
 [facility]    CSN: [id number] Arrival date & time: 02/22/24  0818      History   Chief Complaint Chief Complaint  Patient presents with   Cough    I have a cough and l think bronchitis. - Entered by patient    HPI Cindy Bailey is a 53 y.o. female.   Patient is a smoker.  States she has had a cough for a month.  Coughing up mucus.  Mucus is thick.  Has tried Mucinex.  She states that she was seen in the end of November and given a Z-Pak and some prednisone .  This gave her some improvement but did not get rid of the cough.  She states her chest feels full.  She states her chest feels tight.  She has had some shortness of breath.  She thinks she has bronchitis.  No history of pneumonia.  No fever or chills.  No nausea or vomiting    Past Medical History:  Diagnosis Date   Anxiety    Asthma    Bacterial vaginosis    PID (acute pelvic inflammatory disease)     There are no active problems to display for this patient.   Past Surgical History:  Procedure Laterality Date   WISDOM TOOTH EXTRACTION      OB History     Gravida  0   Para  0   Term  0   Preterm  0   AB  0   Living  0      SAB  0   IAB  0   Ectopic  0   Multiple  0   Live Births  0            Home Medications    Prior to Admission medications   Medication Sig Start Date End Date Taking? Authorizing Provider  benzonatate  (TESSALON ) 200 MG capsule Take 1 capsule (200 mg total) by mouth 2 (two) times daily as needed for cough. 02/22/24  Yes Maranda Jamee Jacob, MD  dextromethorphan-guaiFENesin [facility] DM) 30-600 MG 12hr tablet Take 1 tablet by mouth 2 (two) times daily. 02/22/24  Yes Maranda Jamee Jacob, MD  doxycycline  (VIBRAMYCIN ) 100 MG capsule Take 1 capsule (100 mg total) by mouth 2 (two) times daily. 02/22/24  Yes Maranda Jamee Jacob, MD  oxyCODONE -acetaminophen  (PERCOCET) 10-325 MG tablet Take 1 tablet by mouth 3 (three) times daily as needed. 01/26/24  Yes [provider]  predniSONE  (DELTASONE ) 20 MG tablet Take 2 tablets (40 mg total) by mouth daily with breakfast. 02/22/24  Yes Maranda Jamee Jacob, MD  buPROPion (WELLBUTRIN XL) 150 MG 24 hr tablet Take 150 mg by mouth daily.    [provider]  hydrOXYzine (ATARAX) 25 MG tablet Take 25 mg by mouth 3 (three) times daily.    [provider]  methylphenidate (RITALIN) 5 MG tablet Take by mouth. 06/13/21   [provider]  traZODone (DESYREL) 100 MG tablet Take by mouth. 06/13/21   [provider]    Family History Family History  Problem Relation Age of Onset   Stroke Father     Social History Social History   Tobacco Use   Smoking status: Every Day    Current packs/day: 0.50    Types: Cigarettes   Smokeless tobacco: Never   Tobacco comments:    Mostly vaping  Vaping Use   Vaping status: Former  Substance Use Topics   Alcohol use: Not Currently   Drug  use: Not Currently     Allergies   Peanut-containing drug products, Amoxicillin, and Penicillins   Review of Systems Review of Systems See HPI  Physical Exam Triage Vital Signs ED Triage Vitals  Encounter Vitals Group     BP 02/22/24 0855 (!) 132/91     Girls Systolic BP Percentile --      Girls Diastolic BP Percentile --      Boys Systolic BP Percentile --      Boys Diastolic BP Percentile --      Pulse Rate 02/22/24 0855 67     Resp 02/22/24 0855 18     Temp 02/22/24 0855 97.9 F (36.6 C)     Temp Source 02/22/24 0855 Oral     SpO2 02/22/24 0855 94 %     Weight 02/22/24 0857 143 lb (64.9 kg)     Height 02/22/24 0857 5' 7 (1.702 m)     Head Circumference --      Peak Flow --      Pain Score 02/22/24 0857 0     Pain Loc --      Pain Education --      Exclude from Growth Chart --    No data found.  Updated Vital Signs BP (!) 132/91 (BP Location: Right Arm)   Pulse 67   Temp 97.9 F (36.6 C) (Oral)   Resp 18   Ht 5' 7 (1.702 m)   Wt 64.9 kg   LMP 05/10/2018  (Approximate)   SpO2 94%   BMI 22.40 kg/m      Physical Exam Constitutional:      General: She is not in acute distress.    Appearance: She is well-developed. She is ill-appearing.  HENT:     Head: Normocephalic and atraumatic.     Right Ear: Tympanic membrane normal.     Left Ear: Tympanic membrane normal.     Nose: Congestion present.     Mouth/Throat:     Mouth: Mucous membranes are moist.     Pharynx: Posterior oropharyngeal erythema present.  Eyes:     Conjunctiva/sclera: Conjunctivae normal.     Pupils: Pupils are equal, round, and reactive to light.  Cardiovascular:     Rate and Rhythm: Normal rate and regular rhythm.     Heart sounds: Normal heart sounds.  Pulmonary:     Effort: Pulmonary effort is normal. No respiratory distress.     Breath sounds: Rhonchi present.  Abdominal:     General: There is no distension.     Palpations: Abdomen is soft.  Musculoskeletal:        General: Normal range of motion.     Cervical back: Normal range of motion and neck supple.  Skin:    General: Skin is warm and dry.  Neurological:     Mental Status: She is alert.      UC Treatments / Results  Labs (all labs ordered are listed, but only abnormal results are displayed) Labs Reviewed - No data to display  EKG   Radiology DG Chest 2 View Result Date: 02/22/2024 CLINICAL DATA:  Cough for 4 weeks. Some shortness of breath and chest congestion. Smoker. EXAM: CHEST - 2 VIEW COMPARISON:  None Available. FINDINGS: The heart size and mediastinal contours are within normal limits. Both lungs are clear. The visualized skeletal structures are unremarkable. IMPRESSION: No active cardiopulmonary disease. Electronically Signed   By: Toribio Agreste M.D.   On: 02/22/2024 10:01    Procedures Procedures (including  critical care time)  Medications Ordered in UC Medications - No data to display  Initial Impression / Assessment and Plan / UC Course  I have reviewed the triage vital signs  and the nursing notes.  Pertinent labs & imaging results that were available during my care of the patient were reviewed by me and considered in my medical decision making (see chart for details).     Patient was seen by her prior provider and told she needed a chest x-ray because of her smoking and cough.  She desires chest x-ray today.  I told her that her lungs do not sound like she has a pneumonia, but her cough is persistent so we will do a chest x-ray.  To my review it is clear. Chest x-ray is clear.  This is discussed with patient Final Clinical Impressions(s) / UC Diagnoses   Final diagnoses:  Subacute cough  Acute bronchitis, unspecified organism     Discharge Instructions      You need to drink lots of water  Take the doxycycline  antibiotics 2 times a day.  Take this antibiotic with food Take prednisone  once a day with your morning antibiotic Take the Mucinex DM/Humibid twice a day.  This will help loosen the mucus and reduce your coughing In addition may take Tessalon  twice a day. See your doctor if not improving by next week     ED Prescriptions     Medication Sig Dispense Auth. Provider   dextromethorphan-guaiFENesin (MUCINEX DM) 30-600 MG 12hr tablet Take 1 tablet by mouth 2 (two) times daily. 20 tablet Maranda Jamee Jacob, MD   doxycycline  (VIBRAMYCIN ) 100 MG capsule Take 1 capsule (100 mg total) by mouth 2 (two) times daily. 20 capsule Maranda Jamee Jacob, MD   predniSONE  (DELTASONE ) 20 MG tablet Take 2 tablets (40 mg total) by mouth daily with breakfast. 10 tablet Maranda Jamee Jacob, MD   benzonatate  (TESSALON ) 200 MG capsule Take 1 capsule (200 mg total) by mouth 2 (two) times daily as needed for cough. 20 capsule Maranda Jamee Jacob, MD      PDMP not reviewed this encounter.   Maranda Jamee Jacob, MD 02/22/24 1030

## 2024-02-22 NOTE — ED Triage Notes (Addendum)
 Pt presenting with c/o non productive cough, SOB and chest congestion x 4 weeks. No medication taken for current symptoms. Pt stated that she completed a course ABT and Prednisone  3 weeks ago however cough is persistent.

## 2024-02-22 NOTE — Discharge Instructions (Signed)
 You need to drink lots of water  Take the doxycycline  antibiotics 2 times a day.  Take this antibiotic with food Take prednisone  once a day with your morning antibiotic Take the Mucinex DM/Humibid twice a day.  This will help loosen the mucus and reduce your coughing In addition may take Tessalon  twice a day. See your doctor if not improving by next week

## 2024-02-23 ENCOUNTER — Telehealth: Payer: Self-pay

## 2024-02-23 NOTE — Telephone Encounter (Signed)
 Called to check on patient. Doing ok. Got meds. No needs.

## 2024-02-25 ENCOUNTER — Telehealth: Payer: Self-pay

## 2024-02-25 NOTE — Telephone Encounter (Signed)
 Pt called stating she was feeling worse now since UC visit. Some bodyaches. Advised should continue taking abx as prescribed by Dr Maranda. May also caught a virus, tylenol /motrin as needed. Call if any questions or concerns. Pt verbalized understanding.

## 2024-03-02 ENCOUNTER — Telehealth: Payer: Self-pay

## 2024-03-02 MED ORDER — ALBUTEROL SULFATE HFA 108 (90 BASE) MCG/ACT IN AERS: 1.0000 | INHALATION_SPRAY | Freq: Four times a day (QID) | RESPIRATORY_TRACT | 0 refills | Status: AC | PRN

## 2024-03-02 NOTE — Telephone Encounter (Signed)
 Dr Maranda. Patient called requesting a prescription for an inhaler. She stated she thought she had one when you asked during her visit last week but it had expired. Thanks

## 2024-03-02 NOTE — Telephone Encounter (Signed)
 Nurse note is reviewed.  Inhaler sent to pharmacy
